# Patient Record
Sex: Female | Born: 1970 | Race: Black or African American | Hispanic: No | Marital: Married | Smoking: Never smoker
Health system: Southern US, Community
[De-identification: ages and names within clinical notes are randomized; demographics above are authoritative.]

## PROBLEM LIST (undated history)

## (undated) DIAGNOSIS — D509 Iron deficiency anemia, unspecified: Secondary | ICD-10-CM

## (undated) DIAGNOSIS — Z803 Family history of malignant neoplasm of breast: Secondary | ICD-10-CM

## (undated) DIAGNOSIS — J189 Pneumonia, unspecified organism: Secondary | ICD-10-CM

## (undated) DIAGNOSIS — Z1509 Genetic susceptibility to other malignant neoplasm: Secondary | ICD-10-CM

## (undated) DIAGNOSIS — K5792 Diverticulitis of intestine, part unspecified, without perforation or abscess without bleeding: Secondary | ICD-10-CM

## (undated) DIAGNOSIS — Z1501 Genetic susceptibility to malignant neoplasm of breast: Secondary | ICD-10-CM

## (undated) DIAGNOSIS — Z8481 Family history of carrier of genetic disease: Secondary | ICD-10-CM

## (undated) DIAGNOSIS — D219 Benign neoplasm of connective and other soft tissue, unspecified: Secondary | ICD-10-CM

## (undated) DIAGNOSIS — K219 Gastro-esophageal reflux disease without esophagitis: Secondary | ICD-10-CM

## (undated) DIAGNOSIS — D573 Sickle-cell trait: Secondary | ICD-10-CM

## (undated) DIAGNOSIS — I2699 Other pulmonary embolism without acute cor pulmonale: Secondary | ICD-10-CM

## (undated) DIAGNOSIS — M722 Plantar fascial fibromatosis: Secondary | ICD-10-CM

## (undated) HISTORY — DX: Benign neoplasm of connective and other soft tissue, unspecified: D21.9

## (undated) HISTORY — DX: Plantar fascial fibromatosis: M72.2

## (undated) HISTORY — DX: Genetic susceptibility to malignant neoplasm of breast: Z15.01

## (undated) HISTORY — DX: Family history of carrier of genetic disease: Z84.81

## (undated) HISTORY — DX: Family history of malignant neoplasm of breast: Z80.3

## (undated) HISTORY — PX: BREAST BIOPSY: SHX20

## (undated) HISTORY — DX: Genetic susceptibility to other malignant neoplasm: Z15.09

## (undated) HISTORY — PX: COLONOSCOPY: SHX174

---

## 1999-12-13 HISTORY — PX: WISDOM TOOTH EXTRACTION: SHX21

## 2009-08-12 DIAGNOSIS — J189 Pneumonia, unspecified organism: Secondary | ICD-10-CM

## 2009-08-12 HISTORY — DX: Pneumonia, unspecified organism: J18.9

## 2011-12-14 ENCOUNTER — Encounter (HOSPITAL_BASED_OUTPATIENT_CLINIC_OR_DEPARTMENT_OTHER): Payer: Self-pay

## 2011-12-14 ENCOUNTER — Emergency Department (HOSPITAL_BASED_OUTPATIENT_CLINIC_OR_DEPARTMENT_OTHER)
Admission: EM | Admit: 2011-12-14 | Discharge: 2011-12-14 | Disposition: A | Discharge status: Home / Self Care | Payer: BC Managed Care – PPO | Attending: Emergency Medicine | Admitting: Emergency Medicine

## 2011-12-14 DIAGNOSIS — IMO0002 Reserved for concepts with insufficient information to code with codable children: Secondary | ICD-10-CM | POA: Insufficient documentation

## 2011-12-14 DIAGNOSIS — X58XXXA Exposure to other specified factors, initial encounter: Secondary | ICD-10-CM | POA: Insufficient documentation

## 2011-12-14 DIAGNOSIS — T148XXA Other injury of unspecified body region, initial encounter: Secondary | ICD-10-CM

## 2011-12-14 MED ORDER — CYCLOBENZAPRINE HCL 10 MG PO TABS: 10.0000 mg | ORAL_TABLET | Freq: Two times a day (BID) | ORAL | Status: AC | PRN

## 2011-12-14 MED ORDER — NAPROXEN 500 MG PO TABS: 500.0000 mg | ORAL_TABLET | Freq: Two times a day (BID) | ORAL | Status: AC

## 2011-12-14 MED ORDER — NAPROXEN 250 MG PO TABS
500.0000 mg | ORAL_TABLET | Freq: Once | ORAL | Status: AC
  Administered 2011-12-14: 500 mg via ORAL
  Filled 2011-12-14: qty 2

## 2011-12-14 NOTE — ED Notes (Signed)
Pt c/o of right leg hurting x 2 days ago when trying to get out of bed

## 2011-12-14 NOTE — ED Provider Notes (Signed)
History     CSN: [id number]  Arrival date & time 12/14/11  0449   None     Chief Complaint  Patient presents with  . Leg Pain    Right leg/groin pain    (Consider location/radiation/quality/duration/timing/severity/associated sxs/prior treatment) HPI Comments: 2 days ago the patient states that during physical activity she developed right groin pain. This pain is worse when she moves her leg, flexes at the hip or rotates. It is also worse with palpation over the inguinal and proximal thigh area. Symptoms are persistent, mild at rest but moderate with movement and not associated with swelling, vomiting, change in bowel habits or dysuria. She has taken ibuprofen prior to arrival with mild improvement  Patient is a 41 y.o. female presenting with leg pain. The history is provided by the patient and the spouse.  Leg Pain     History reviewed. No pertinent past medical history.  No past surgical history on file.  No family history on file.  History  Substance Use Topics  . Smoking status: Not on file  . Smokeless tobacco: Not on file  . Alcohol Use: Not on file    OB History    Grav Para Term Preterm Abortions TAB SAB Ect Mult Living                  Review of Systems  Gastrointestinal: Negative for nausea, vomiting and diarrhea.  Musculoskeletal: Negative for back pain.  Skin: Negative for rash.    Allergies  Review of patient's allergies indicates no known allergies.  Home Medications   Current Outpatient Rx  Name Route Sig Dispense Refill  . CYCLOBENZAPRINE HCL 10 MG PO TABS Oral Take 1 tablet (10 mg total) by mouth 2 (two) times daily as needed for muscle spasms. 20 tablet 0  . NAPROXEN 500 MG PO TABS Oral Take 1 tablet (500 mg total) by mouth 2 (two) times daily with a meal. 30 tablet 0    BP 175/107  Pulse 87  Temp(Src) 98 F (36.7 C) (Oral)  Resp 20  SpO2 99%  Physical Exam  Constitutional: She appears well-developed and well-nourished. No distress.    HENT:  Head: Normocephalic and atraumatic.  Eyes: Conjunctivae are normal. No scleral icterus.  Cardiovascular: Normal rate, regular rhythm, normal heart sounds and intact distal pulses.   Pulmonary/Chest: Effort normal and breath sounds normal. No respiratory distress. She has no wheezes. She has no rales.  Abdominal: Soft. Bowel sounds are normal. She exhibits no distension. There is no tenderness.  Musculoskeletal: Normal range of motion. She exhibits tenderness ( Tender to palpation just distal to the anterior superior iliac spine on the right, no pain over the bony area of the pelvis. Pain with straight leg raise but ability to lift the leg off the bed and hold it for 5 seconds. No pain with flexion of the knee ). She exhibits no edema.       No pain with range of motion of the joints of the left leg or the right ankle or knee.  Neurological: She is alert.       Normal sensation of the bilateral lower extremities    ED Course  Procedures (including critical care time)  Labs Reviewed - No data to display No results found.   1. Muscle strain       MDM  Likely strain of the hip flexors on the right. Patient declines intramuscular medications, will give Naprosyn, muscle relaxant for home  Vida Roller, MD 12/14/11 [phone]

## 2011-12-14 NOTE — ED Notes (Signed)
Patient reports she was having sex with her husband and pulled the muscle in her right leg and groin.

## 2012-08-30 ENCOUNTER — Encounter (HOSPITAL_BASED_OUTPATIENT_CLINIC_OR_DEPARTMENT_OTHER): Payer: Self-pay | Admitting: *Deleted

## 2012-08-30 ENCOUNTER — Emergency Department (HOSPITAL_BASED_OUTPATIENT_CLINIC_OR_DEPARTMENT_OTHER): Payer: BC Managed Care – PPO

## 2012-08-30 ENCOUNTER — Emergency Department (HOSPITAL_BASED_OUTPATIENT_CLINIC_OR_DEPARTMENT_OTHER)
Admission: EM | Admit: 2012-08-30 | Discharge: 2012-08-30 | Disposition: A | Discharge status: Home / Self Care | Payer: BC Managed Care – PPO | Attending: Emergency Medicine | Admitting: Emergency Medicine

## 2012-08-30 ENCOUNTER — Other Ambulatory Visit: Payer: Self-pay

## 2012-08-30 DIAGNOSIS — T148XXA Other injury of unspecified body region, initial encounter: Secondary | ICD-10-CM

## 2012-08-30 DIAGNOSIS — K219 Gastro-esophageal reflux disease without esophagitis: Secondary | ICD-10-CM | POA: Insufficient documentation

## 2012-08-30 DIAGNOSIS — R079 Chest pain, unspecified: Secondary | ICD-10-CM | POA: Insufficient documentation

## 2012-08-30 HISTORY — DX: Gastro-esophageal reflux disease without esophagitis: K21.9

## 2012-08-30 LAB — CBC
MCH: 28.6 pg (ref 26.0–34.0)
MCV: 80.7 fL (ref 78.0–100.0)
Platelets: 291 10*3/uL (ref 150–400)
RBC: 4.3 MIL/uL (ref 3.87–5.11)

## 2012-08-30 LAB — BASIC METABOLIC PANEL
BUN: 8 mg/dL (ref 6–23)
CO2: 24 mEq/L (ref 19–32)
Calcium: 9.1 mg/dL (ref 8.4–10.5)
Creatinine, Ser: 0.8 mg/dL (ref 0.50–1.10)
Glucose, Bld: 78 mg/dL (ref 70–99)

## 2012-08-30 LAB — D-DIMER, QUANTITATIVE: D-Dimer, Quant: 0.45 ug/mL-FEU (ref 0.00–0.48)

## 2012-08-30 NOTE — ED Notes (Signed)
Pt c/o left rib /left chest pain with movt x 2 days

## 2012-08-30 NOTE — ED Provider Notes (Signed)
History     CSN: [id number]  Arrival date & time 08/30/12  1333   First MD Initiated Contact with Patient 08/30/12 1347      Chief Complaint  Patient presents with  . Chest Pain    (Consider location/radiation/quality/duration/timing/severity/associated sxs/prior treatment) The history is provided by the patient and medical records.   Claudia Rivas is a 41 y.o. female presents to the emergency department complaining of chest pain.  The onset of the symptoms was  abrupt starting 3 days ago.  The patient has associated pain with movement and inspiration.  The symptoms have been  intermittent, unchanged.  Movement, palpation, deep inspiration makes the symptoms worse and nothing makes symptoms better.  The patient denies fever, chills, headache, neck pain, back pain, nausea, vomiting, diarrhea, diaphoresis, lightheadedness, weakness, syncope.  Pt states her CP began 3 days ago as a sharp pain which resolved after about 1 hr.  She then had an episode of substernal "chest heaviness" yesterday about 5pm which lasted for several hours; she had no associated nausea, diaphoresis, shortness of breath or DOE.  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 The sharp pain is located in the L chest and under the L breast, it is rated at a 6/10 and radiates into the axilla.  She has no Hx of DVT.  She does take an OCP.  Pt flew from [place] on Sept 4th, but no long travel since.  She has not noticed any swelling in her legs or SOB.  Current pain increases with movement, especially moving from lying to sitting.  Patient thinks she probably just pulled a muscle but is concerned stating there is a lot of stress in her life right now.  History of GERD for which she takes Prilosec.    Past Medical History  Diagnosis Date  . GERD (gastroesophageal reflux disease)     History reviewed. No pertinent past surgical history.  History reviewed. No pertinent family history.  History    Substance Use Topics  . Smoking status: Never Smoker   . Smokeless tobacco: Not on file  . Alcohol Use: No    OB History    Grav Para Term Preterm Abortions TAB SAB Ect Mult Living                  Review of Systems  Constitutional: Negative for fever, diaphoresis, appetite change, fatigue and unexpected weight change.  HENT: Negative for mouth sores and neck stiffness.   Eyes: Negative for visual disturbance.  Respiratory: Negative for cough, chest tightness, shortness of breath and wheezing.   Cardiovascular: Positive for chest pain. Negative for palpitations and leg swelling.  Gastrointestinal: Negative for nausea, vomiting, abdominal pain, diarrhea and constipation.  Genitourinary: Negative for dysuria, urgency, frequency and hematuria.  Musculoskeletal: Negative for back pain and gait problem.  Skin: Negative for rash.  Neurological: Negative for syncope, light-headedness and headaches.  Hematological: Does not bruise/bleed easily.  Psychiatric/Behavioral: Negative for disturbed wake/sleep cycle. The patient is not nervous/anxious.     Allergies  Review of patient's allergies indicates no known allergies.  Home Medications   Current Outpatient Rx  Name Route Sig Dispense Refill  . OMEPRAZOLE 40 MG PO CPDR Oral Take 40 mg by mouth daily.    Marland Kitchen NAPROXEN 500 MG PO TABS Oral Take 1 tablet (500 mg total) by mouth 2 (two) times daily with a meal. 30 tablet 0    BP 132/82  Pulse 70  Temp 98.1  F (36.7 C) (Oral)  Resp 16  Ht 5\' 1"  (1.549 m)  Wt 156 lb (70.761 kg)  BMI 29.48 kg/m2  SpO2 100%  LMP 08/27/2012  Physical Exam  Nursing note and vitals reviewed. Constitutional: She appears well-developed and well-nourished. No distress.  HENT:  Head: Normocephalic and atraumatic.  Mouth/Throat: Oropharynx is clear and moist. No oropharyngeal exudate.  Eyes: Conjunctivae normal are normal. Pupils are equal, round, and reactive to light. No scleral icterus.  Neck: Normal  range of motion. Neck supple.  Cardiovascular: Normal rate, S1 normal, S2 normal, normal heart sounds, intact distal pulses and normal pulses.  An irregular rhythm present.  No murmur heard. Pulmonary/Chest: Effort normal and breath sounds normal. No respiratory distress. She has no decreased breath sounds. She has no wheezes. She has no rhonchi. She has no rales. She exhibits tenderness (mild in the L axilla and under the L breast).  Abdominal: Soft. Bowel sounds are normal. She exhibits no mass. There is no tenderness. There is no rebound and no guarding.  Musculoskeletal: Normal range of motion. She exhibits no edema.       Right lower leg: She exhibits no tenderness, no swelling and no edema.       Left lower leg: She exhibits no tenderness, no swelling and no edema.  Neurological: She is alert.       Speech is clear and goal oriented Moves extremities without ataxia  Skin: Skin is warm and dry. No rash noted. She is not diaphoretic.  Psychiatric: She has a normal mood and affect.    ED Course  Procedures (including critical care time)  Labs Reviewed  CBC - Abnormal; Notable for the following:    HCT 34.7 (*)     All other components within normal limits  BASIC METABOLIC PANEL - Abnormal; Notable for the following:    GFR calc non Af Amer 89 (*)     All other components within normal limits  D-DIMER, QUANTITATIVE  TROPONIN I   Dg Chest 2 View  08/30/2012  *RADIOLOGY REPORT*  Clinical Data: Left anterior chest pain and pressure.  CHEST - 2 VIEW  Comparison: None available.  Findings: The heart size is normal.  The lungs are clear.  The visualized soft tissues and bony thorax are unremarkable.  IMPRESSION: Negative chest.   Original Report Authenticated By: Jamesetta Orleans. MATTERN, M.D.    Results for orders placed during the hospital encounter of 08/30/12  CBC      Component Value Range   WBC 6.2  4.0 - 10.5 K/uL   RBC 4.30  3.87 - 5.11 MIL/uL   Hemoglobin 12.3  12.0 - 15.0 g/dL    HCT 16.1 (*) 09.6 - 46.0 %   MCV 80.7  78.0 - 100.0 fL   MCH 28.6  26.0 - 34.0 pg   MCHC 35.4  30.0 - 36.0 g/dL   RDW 04.5  40.9 - 81.1 %   Platelets 291  150 - 400 K/uL  BASIC METABOLIC PANEL      Component Value Range   Sodium 139  135 - 145 mEq/L   Potassium 3.6  3.5 - 5.1 mEq/L   Chloride 105  96 - 112 mEq/L   CO2 24  19 - 32 mEq/L   Glucose, Bld 78  70 - 99 mg/dL   BUN 8  6 - 23 mg/dL   Creatinine, Ser 9.14  0.50 - 1.10 mg/dL   Calcium 9.1  8.4 - 78.2 mg/dL  GFR calc non Af Amer 89 (*) >90 mL/min   GFR calc Af Amer >90  >90 mL/min  D-DIMER, QUANTITATIVE      Component Value Range   D-Dimer, Quant 0.45  0.00 - 0.48 ug/mL-FEU  TROPONIN I      Component Value Range   Troponin I <0.30  <0.30 ng/mL   Dg Chest 2 View  08/30/2012  *RADIOLOGY REPORT*  Clinical Data: Left anterior chest pain and pressure.  CHEST - 2 VIEW  Comparison: None available.  Findings: The heart size is normal.  The lungs are clear.  The visualized soft tissues and bony thorax are unremarkable.  IMPRESSION: Negative chest.   Original Report Authenticated By: Jamesetta Orleans. MATTERN, M.D.    ECG:  Date: 08/30/2012  Rate: 77  Rhythm: sinus arrhythmia  QRS Axis: normal  Intervals: normal  ST/T Wave abnormalities: normal  Conduction Disutrbances:none  Narrative Interpretation: sinus arrhythmia  Old EKG Reviewed: none available     1. Chest pain   2. Muscle strain       MDM  Claudia Rivas presents with chest pain x3 days.  Pt with low risk Well's PE Criteria Score.  She is not PERC negative 2/2 her OCP use, therefore we will obtain a d-dimer.  Likely MSK pain but will obtain a cardiac work-up.  BMP, CBC unremarkable. Troponin and d-dimer are negative. Chest x-ray negative.  Patient vitals stable throughout visit.  Patient alert nontoxic, nonseptic appearing.  Negative cardiac workup at this time.  Atypical chest pain that is reproducible, likely pulled muscle.  Patient is to be discharged with  recommendation to follow up with PCP in regards to today's hospital visit. Chest pain is not likely of cardiac or pulmonary etiology d/t presentation, VSS, no tracheal deviation, no JVD or new murmur, RRR, breath sounds equal bilaterally., EKG without acute abnormalities, negative troponin, and negative CXR. Pt has been advised to continue her PPI and return to the ED if CP becomes exertional, associated with diaphoresis or nausea, radiates to left jaw/arm, worsens or becomes concerning in any way. Pt appears reliable for follow up and is agreeable to discharge.     Case has been discussed with Dr. Oletta Lamas who agrees with the above plan to discharge.   1. Medications: Continue home medications as usual 2. Treatment: Rest, drink plenty of fluids, no heavy lifting or pulling 3. Follow Up: With primary care physician this week for continued evaluation  Read instructions below for reasons to return to the Emergency Department. It is recommended that your follow up with your Primary Care Doctor in regards to today's visit. If you do not have a doctor, use the resource guide listed below to help you find one. Begin taking over the counter Prilosec or Zegrid as directed.   Chest Pain (Nonspecific)  HOME CARE INSTRUCTIONS  For the next few days, avoid physical activities that bring on chest pain. Continue physical activities as directed.  Do not smoke cigarettes or drink alcohol until your symptoms are gone.  Only take over-the-counter or prescription medicine for pain, discomfort, or fever as directed by your caregiver.  Follow your caregiver's suggestions for further testing if your chest pain does not go away.  Keep any follow-up appointments you made. If you do not go to an appointment, you could develop lasting (chronic) problems with pain. If there is any problem keeping an appointment, you must call to reschedule.  SEEK MEDICAL CARE IF:  You think you are having problems from the  medicine you are  taking. Read your medicine instructions carefully.  Your chest pain does not go away, even after treatment.  You develop a rash with blisters on your chest.  SEEK IMMEDIATE MEDICAL CARE IF:  You have increased chest pain or pain that spreads to your arm, neck, jaw, back, or belly (abdomen).  You develop shortness of breath, an increasing cough, or you are coughing up blood.  You have severe back or abdominal pain, feel sick to your stomach (nauseous) or throw up (vomit).  You develop severe weakness, fainting, or chills.  You have an oral temperature above 102 F (38.9 C), not controlled by medicine.   THIS IS AN EMERGENCY. Do not wait to see if the pain will go away. Get medical help at once. Call your local emergency services (911 in [place] Do not drive yourself to the hospital.   RESOURCE GUIDE  Dental Problems  Patients with Medicaid: [facility] [phone]                                           [phone] [address] Phone:  [phone]                                                  Phone:  [phone]  If unable to pay or uninsured, contact:  [facility] or [facility]. to become qualified for the adult dental clinic.  Chronic Pain Problems Contact [facility]  [phone] Patients need to be referred by their primary care doctor.  Insufficient Money for Medicine Contact [employer]:  call "211" or [facility] [phone].  No Primary Care Doctor Call [facility]  [phone]) [phone] Other agencies that provide inexpensive medical care    [facility]  [phone]    [facility]  [phone]    [facility]  [phone]    [facility]  [phone]    [facility]  [phone]    [facility]  [phone]  Psychological Services [facility]  [phone] [facility] Services  [phone] [facility]   [phone]  (emergency services [phone])  Substance Abuse Resources [facility]  [phone] [facility] [phone] [facility] [phone] Floydene Flock [phone] Residential & Outpatient Substance Abuse Program  [phone]  Abuse/Neglect [facility] Child Abuse Hotline 937-294-6801 [facility] Child Abuse Hotline 818-217-0988 (After Hours)  Emergency Shelter [facility] 947-151-1315  M[facility] 250-364-2052 [facility] [phone]  MRSA Hotline #:   (681) 556-9263    [facility] Resources  [facility]     [employer]                          [facility] Dept. [address] [place]                       [address]  [address]  [place]                                                Cristobal Goldmann Phone:  [phone]                                   Phone:  [phone]                 Phone:  [phone]  [facility] Mental Health Phone:  [phone]  [facility] Child Abuse Hotline 310-467-4951 (210) 571-1250 (After Hours)           Dierdre Forth, PA-C 08/30/12 1619  Dierdre Forth, PA-C 08/30/12 [phone]

## 2012-08-31 NOTE — ED Provider Notes (Signed)
History/physical exam/procedure(s) were performed by non-physician practitioner and as supervising physician I was immediately available for consultation/collaboration. I have reviewed all notes and am in agreement with care and plan.   Hilario Quarry, MD 08/31/12 [phone]

## 2013-02-01 ENCOUNTER — Encounter: Payer: Self-pay | Admitting: Obstetrics & Gynecology

## 2015-11-28 DIAGNOSIS — F4321 Adjustment disorder with depressed mood: Secondary | ICD-10-CM | POA: Insufficient documentation

## 2015-11-28 DIAGNOSIS — Z91040 Latex allergy status: Secondary | ICD-10-CM | POA: Insufficient documentation

## 2015-11-28 DIAGNOSIS — D126 Benign neoplasm of colon, unspecified: Secondary | ICD-10-CM | POA: Insufficient documentation

## 2015-12-02 DIAGNOSIS — K649 Unspecified hemorrhoids: Secondary | ICD-10-CM | POA: Insufficient documentation

## 2016-08-16 ENCOUNTER — Inpatient Hospital Stay (HOSPITAL_BASED_OUTPATIENT_CLINIC_OR_DEPARTMENT_OTHER)
Admission: EM | Admit: 2016-08-16 | Discharge: 2016-08-18 | DRG: 175 | Disposition: A | Discharge status: Home / Self Care | Payer: Managed Care, Other (non HMO) | Attending: Internal Medicine | Admitting: Internal Medicine

## 2016-08-16 ENCOUNTER — Encounter (HOSPITAL_BASED_OUTPATIENT_CLINIC_OR_DEPARTMENT_OTHER): Payer: Self-pay

## 2016-08-16 ENCOUNTER — Emergency Department (HOSPITAL_BASED_OUTPATIENT_CLINIC_OR_DEPARTMENT_OTHER): Payer: Managed Care, Other (non HMO)

## 2016-08-16 DIAGNOSIS — Z803 Family history of malignant neoplasm of breast: Secondary | ICD-10-CM

## 2016-08-16 DIAGNOSIS — N920 Excessive and frequent menstruation with regular cycle: Secondary | ICD-10-CM | POA: Diagnosis present

## 2016-08-16 DIAGNOSIS — D500 Iron deficiency anemia secondary to blood loss (chronic): Secondary | ICD-10-CM | POA: Diagnosis present

## 2016-08-16 DIAGNOSIS — I2699 Other pulmonary embolism without acute cor pulmonale: Secondary | ICD-10-CM

## 2016-08-16 DIAGNOSIS — E876 Hypokalemia: Secondary | ICD-10-CM | POA: Diagnosis present

## 2016-08-16 DIAGNOSIS — I2609 Other pulmonary embolism with acute cor pulmonale: Principal | ICD-10-CM | POA: Diagnosis present

## 2016-08-16 DIAGNOSIS — K219 Gastro-esophageal reflux disease without esophagitis: Secondary | ICD-10-CM | POA: Diagnosis present

## 2016-08-16 DIAGNOSIS — D509 Iron deficiency anemia, unspecified: Secondary | ICD-10-CM | POA: Diagnosis present

## 2016-08-16 HISTORY — DX: Sickle-cell trait: D57.3

## 2016-08-16 HISTORY — DX: Iron deficiency anemia, unspecified: D50.9

## 2016-08-16 HISTORY — DX: Other pulmonary embolism without acute cor pulmonale: I26.99

## 2016-08-16 HISTORY — DX: Pneumonia, unspecified organism: J18.9

## 2016-08-16 HISTORY — DX: Diverticulitis of intestine, part unspecified, without perforation or abscess without bleeding: K57.92

## 2016-08-16 LAB — BASIC METABOLIC PANEL
ANION GAP: 7 (ref 5–15)
BUN: 10 mg/dL (ref 6–20)
CHLORIDE: 108 mmol/L (ref 101–111)
CO2: 24 mmol/L (ref 22–32)
Calcium: 8.6 mg/dL — ABNORMAL LOW (ref 8.9–10.3)
Creatinine, Ser: 0.7 mg/dL (ref 0.44–1.00)
GFR calc non Af Amer: >60 mL/min (ref >60)
GLUCOSE: 90 mg/dL (ref 65–99)
POTASSIUM: 3.6 mmol/L (ref 3.5–5.1)
SODIUM: 139 mmol/L (ref 135–145)

## 2016-08-16 LAB — CBC
HEMATOCRIT: 28.6 % — AB (ref 36.0–46.0)
Hemoglobin: 9.4 g/dL — ABNORMAL LOW (ref 12.0–15.0)
MCH: 22.9 pg — AB (ref 26.0–34.0)
MCHC: 32.9 g/dL (ref 30.0–36.0)
MCV: 69.6 fL — AB (ref 78.0–100.0)
Platelets: 354 10*3/uL (ref 150–400)
RBC: 4.11 MIL/uL (ref 3.87–5.11)
RDW: 15.1 % (ref 11.5–15.5)
WBC: 6.1 10*3/uL (ref 4.0–10.5)

## 2016-08-16 LAB — D-DIMER, QUANTITATIVE: D-Dimer, Quant: 0.84 ug/mL-FEU — ABNORMAL HIGH (ref 0.00–0.50)

## 2016-08-16 LAB — TROPONIN I: Troponin I: <0.03 ng/mL (ref <0.03)

## 2016-08-16 MED ORDER — HEPARIN BOLUS VIA INFUSION
3800.0000 [IU] | Freq: Once | INTRAVENOUS | Status: AC
  Administered 2016-08-16: 3800 [IU] via INTRAVENOUS

## 2016-08-16 MED ORDER — HEPARIN (PORCINE) IN NACL 100-0.45 UNIT/ML-% IJ SOLN
1100.0000 [IU]/h | INTRAMUSCULAR | Status: AC
  Administered 2016-08-16: 800 [IU]/h via INTRAVENOUS
  Administered 2016-08-17: 1100 [IU]/h via INTRAVENOUS
  Filled 2016-08-16 (×2): qty 250

## 2016-08-16 MED ORDER — IOPAMIDOL (ISOVUE-370) INJECTION 76%
100.0000 mL | Freq: Once | INTRAVENOUS | Status: AC | PRN
  Administered 2016-08-16: 100 mL via INTRAVENOUS

## 2016-08-16 NOTE — ED Notes (Signed)
Report given to Tonya RN

## 2016-08-16 NOTE — Progress Notes (Signed)
This is a no charge note  Transfer from [facility] per Dr. Kathrynn Humble  45 year old lady with past medical history for GERD and diverticulitis, who presents with intermittent right-sided chest pain for 2 weeks, shortness of breath. Recent flight to [place]. Found to have pulmonary embolism. Currently hemodynamically stable. Troponin negative. IV heparin was started.  Pt is accepted to tele bed for obs.  Ivor Costa, MD  [facility] Pager [phone]  If 7PM-7AM, please contact night-coverage [email or web address] Password [email or web address] 08/16/2016, 10:46 PM

## 2016-08-16 NOTE — ED Provider Notes (Signed)
[place] DEPT [facility] Provider Note   CSN: [id number] Arrival date & time: 08/16/16  1750  By signing my name below, I, Jeanell Sparrow, attest that this documentation has been prepared under the direction and in the presence of Varney Biles, MD . Electronically Signed: Jeanell Sparrow, Scribe. 08/16/2016. 6:42 PM.  History   Chief Complaint Chief Complaint  Patient presents with  . Chest Pain   The history is provided by the patient.   HPI Comments: Claudia Rivas is a 45 y.o. female who presents to the Emergency Department complaining of intermittent moderate substernal chest pain that started a few weeks ago. Pt describes the pain as heavy, radiating to her right side, exacerbated by movement, and relieved by nothing. Today's episode lasted for a full 4 hours. Associated symptoms include mild SOB and dizziness. She had a recent flight to [place] lasting 4 hours, a hx of acid reflux, and hx of constant BCP use. Denies diaphoresis, leg pain, leg swelling, hx of DVT, DM, HTN, smoking, alcohol use, other drug use, cancer, or recent surgery.   Past Medical History:  Diagnosis Date  . Diverticulitis   . GERD (gastroesophageal reflux disease)     Patient Active Problem List   Diagnosis Date Noted  . Pulmonary embolism ([place]) 08/16/2016    History reviewed. No pertinent surgical history.  OB History    No data available       Home Medications    Prior to Admission medications   Not on File    Family History No family history on file.  Social History Social History  Substance Use Topics  . Smoking status: Never Smoker  . Smokeless tobacco: Never Used  . Alcohol use Yes     Comment: occ     Allergies   Review of patient's allergies indicates no known allergies.   Review of Systems Review of Systems  10 Systems reviewed and all are negative for acute change except as noted in the HPI.  Physical Exam Updated Vital Signs BP 163/100 (BP Location: Right Arm)    Pulse 90   Temp 98.7 F (37.1 C) (Oral)   Resp 18   Ht 5\' 1"  (1.549 m)   Wt 160 lb (72.6 kg)   LMP 08/03/2016   SpO2 100%   BMI 30.23 kg/m   Physical Exam  Constitutional: She is oriented to person, place, and time. She appears well-developed and well-nourished. No distress.  HENT:  Head: Normocephalic and atraumatic.  Right Ear: External ear normal.  Left Ear: External ear normal.  Eyes: Conjunctivae are normal. Right eye exhibits no discharge. Left eye exhibits no discharge. No scleral icterus.  Neck: Neck supple. No JVD present. No tracheal deviation present.  Cardiovascular: Normal rate and regular rhythm.   Pulmonary/Chest: Effort normal. No stridor. No respiratory distress. She has no wheezes. She has no rales.  Abdominal: Soft. There is no tenderness.  Musculoskeletal: She exhibits no edema.  No pitting edema. No TTP in the BLE or calf area.   Neurological: She is alert and oriented to person, place, and time. Cranial nerve deficit: no gross deficits.  Skin: Skin is warm and dry. No rash noted.  Psychiatric: She has a normal mood and affect.  Nursing note and vitals reviewed.    ED Treatments / Results  DIAGNOSTIC STUDIES: Oxygen Saturation is 100% on RA, normal by my interpretation.    COORDINATION OF CARE: 6:51 PM- Pt advised of plan for treatment, which includes labs, and pt agrees.  Labs (  all labs ordered are listed, but only abnormal results are displayed) Labs Reviewed  BASIC METABOLIC PANEL - Abnormal; Notable for the following:       Result Value   Calcium 8.6 (*)    All other components within normal limits  CBC - Abnormal; Notable for the following:    Hemoglobin 9.4 (*)    HCT 28.6 (*)    MCV 69.6 (*)    MCH 22.9 (*)    All other components within normal limits  D-DIMER, QUANTITATIVE (NOT AT [facility]) - Abnormal; Notable for the following:    D-Dimer, Quant 0.84 (*)    All other components within normal limits  TROPONIN I  TROPONIN I    EKG   EKG Interpretation  Date/Time:  Tuesday August 16 2016 17:59:07 EDT Ventricular Rate:  79 PR Interval:  148 QRS Duration: 80 QT Interval:  366 QTC Calculation: 419 R Axis:   77 Text Interpretation:  Normal sinus rhythm Normal ECG No acute changes No significant change since last tracing Confirmed by Kathrynn Humble, MD, Thelma Comp [id number]) on 08/16/2016 6:32:40 PM       Radiology Ct Angio Chest Pe W/cm &/or Wo Cm  Result Date: 08/16/2016 CLINICAL DATA:  Right chest pain for 2 weeks intermittent. Shortness of breath. Dyspnea. Elevated D-dimer level. EXAM: CT ANGIOGRAPHY CHEST WITH CONTRAST TECHNIQUE: Multidetector CT imaging of the chest was performed using the standard protocol during bolus administration of intravenous contrast. Multiplanar CT image reconstructions and MIPs were obtained to evaluate the vascular anatomy. CONTRAST:  100 cc Isovue 370 COMPARISON:  08/30/2012 chest radiograph FINDINGS: Cardiovascular: Segmental thrombus in the anteromedial segment left lower lobe, image 67/7. This could be acute or chronic. Tiny subsegmental filling defect in the right middle lobe pulmonary artery, images 124-125 series 6, possibly also age-indeterminate embolus. 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 Mild cardiomegaly. Mediastinum/Nodes: Enlarged right axillary and subpectoral lymph nodes, a right axillary node measures 1.4 cm in short axis on image 1 28 series 4. I do not see an obvious right breast mass. An adjacent right subpectoral lymph node measures 1.3 cm in short axis on image 25/4. Left axillary lymph nodes are considerably smaller. Lungs/Pleura: Linear subsegmental atelectasis in the right lower lobe. Upper Abdomen: Scarring in the right kidney upper pole Musculoskeletal: Unremarkable Review of the MIP images confirms the above findings. IMPRESSION: 1. There is a small amount of age indeterminate pulmonary embolus in a segmental branch of the left lower lobe (anteromedial  segment). There is potentially also a tiny subsegmental marginal thrombus in the right middle lobe. Right ventricular to left ventricular ratio is 0.85, below the threshold for submassive pulmonary embolus. 2. Mild cardiomegaly. Aberrant right subclavian artery passes behind the esophagus. 3. Mildly enlarged right axillary and subpectoral lymph nodes. I do not see a breast mass but cause for this adenopathy is uncertain. Physical exam follow up is recommended to ensure that these fully resolve, if they do not think may warrant further workup to exclude the possibility of malignancy. Also if the patient is not had recent mammography then I do recommend a mammographic workup. Critical Value/emergent results were called by telephone at the time of interpretation on 08/16/2016 at 10:05 pm to Dr. Varney Biles , who verbally acknowledged these results. Electronically Signed   By: Van Clines M.D.   On: 08/16/2016 22:10    Procedures .Critical Care Performed by: Varney Biles Authorized by: Varney Biles   Critical care provider statement:    Critical care  time (minutes):  40   Critical care time was exclusive of:  Separately billable procedures and treating other patients   Critical care was necessary to treat or prevent imminent or life-threatening deterioration of the following conditions: pulmopnary embolism.   Critical care was time spent personally by me on the following activities:  Blood draw for specimens, development of treatment plan with patient or surrogate, discussions with consultants, evaluation of patient's response to treatment, examination of patient, interpretation of cardiac output measurements, obtaining history from patient or surrogate, ordering and performing treatments and interventions, ordering and review of radiographic studies, ordering and review of laboratory studies, pulse oximetry and re-evaluation of patient's condition   (including critical care  time)  Medications Ordered in ED Medications  heparin ADULT infusion 100 units/mL (25000 units/245mL sodium chloride 0.45%) (800 Units/hr Intravenous Transfusing/Transfer 08/16/16 2305)  iopamidol (ISOVUE-370) 76 % injection 100 mL (100 mLs Intravenous Contrast Given 08/16/16 2134)  heparin bolus via infusion 3,800 Units (3,800 Units Intravenous Bolus from Bag 08/16/16 2233)     Initial Impression / Assessment and Plan / ED Course  I have reviewed the triage vital signs and the nursing notes.  Pertinent labs & imaging results that were available during my care of the patient were reviewed by me and considered in my medical decision making (see chart for details).  Clinical Course    PT with atypical chest pain. She has been on birth control for several years and had recent travel. Dimer was elevated - CT PE is +. Will admit. Heparin started. Slight R sided heart strain per CT.  Final Clinical Impressions(s) / ED Diagnoses   Final diagnoses:  Other acute pulmonary embolism with acute cor pulmonale ([place])    New Prescriptions There are no discharge medications for this patient.     Varney Biles, MD 08/17/16 [phone]

## 2016-08-16 NOTE — H&P (Signed)
History and Physical    Claudia Rivas [id number] DOB: 1970-12-23 DOA: 08/16/2016  Referring MD/NP/PA:   PCP: Garlan Fillers, MD   Patient coming from:  The patient is coming from home.  At baseline, pt is independent for most of ADL.      Chief Complaint: Chest pain and shortness of breath  HPI: Claudia Rivas is a 45 y.o. female with medical history significant of diverticulitis, GERD, left foot fasciitis, who presented with chest pain and shortness breath.  Patient reports that she has been having intermittent chest pain in the past 3 weeks. The chest pain is located in the right side of chest, intermittent, sharp, 7 out of 10 in severity, nonradiating. It is aggravated by deep breath. It is associated with shortness of breath. Patient has mild dry cough, but no fever or chills. Patient states that she flight to [place] on 07/15/16 which was 4.5 hour journey. She has been taking oral contraceptive for many years.  Patient denies nausea, vomiting, diarrhea, abdominal pain, symptoms of UTI. She states that she has regular, but heavy menstrual period for many years. Has not seen any OB/GYN doctor.  ED Course: pt was found to have  positive d-dimer 0.84, troponin negative 2, WBC 6.1 hemoglobin 9.4, temperature normal, no tachycardia, mildly tachypneic, electrolytes renal function okay. Pt is placed on tele bed for obs.  # CT angiogram showed 1.There is a small amount of age indeterminate pulmonary embolus in a segmental branch of the left lower lobe (anteromedial segment). There is potentially also a tiny subsegmental marginal thrombus in the right middle lobe. Right ventricular to left ventricular ratio is 0.85, below the threshold for submassive pulmonary embolus. 2. Mild cardiomegaly. Aberrant right subclavian artery passes behind the esophagus. 3. Mildly enlarged right axillary and subpectoral lymph nodes.   Review of Systems:   General: no fevers, chills, no changes in body  weight, has fatigue HEENT: no blurry vision, hearing changes or sore throat Respiratory: has dyspnea, coughing,no wheezing CV: has chest pain, no palpitations GI: no nausea, vomiting, abdominal pain, diarrhea, constipation GU: no dysuria, burning on urination, increased urinary frequency, hematuria  Ext: no leg edema Neuro: no unilateral weakness, numbness, or tingling, no vision change or hearing loss Skin: no rash, no skin tear. MSK: No muscle spasm, no deformity, no limitation of range of movement in spin Heme: No easy bruising.  Travel history: No recent long distant travel.  Allergy: No Known Allergies  Past Medical History:  Diagnosis Date  . Diverticulitis   . GERD (gastroesophageal reflux disease)     History reviewed. No pertinent surgical history.  Social History:  reports that she has never smoked. She has never used smokeless tobacco. She reports that she drinks alcohol. She reports that she does not use drugs.  Family History:  Family History  Problem Relation Age of Onset  . Breast cancer Mother   . Crohn's disease Mother   . Crohn's disease Sister   . Diverticulitis Brother      Prior to Admission medications   Not on File    Physical Exam: Vitals:   08/16/16 2300 08/16/16 2330 08/17/16 0028 08/17/16 0029  BP: 147/85 144/90  (!) 156/92  Pulse: 87 87  89  Resp: (!) 27 21  18   Temp:    98.5 F (36.9 C)  TempSrc:    Oral  SpO2: 99% 99%  99%  Weight:   70.7 kg (155 lb 12.8 oz)   Height:  General: Not in acute distress HEENT:       Eyes: PERRL, EOMI, no scleral icterus.       ENT: No discharge from the ears and nose, no pharynx injection, no tonsillar enlargement.        Neck: No JVD, no bruit, no mass felt. Heme: No neck lymph node enlargement. Cardiac: S1/S2, RRR, No murmurs, No gallops or rubs. Respiratory: No rales, wheezing, rhonchi or rubs. GI: Soft, nondistended, nontender, no rebound pain, no organomegaly, BS present. GU: No  hematuria Ext: No pitting leg edema bilaterally. 2+DP/PT pulse bilaterally. Musculoskeletal: No joint deformities, No joint redness or warmth, no limitation of ROM in spin. Skin: No rashes.  Neuro: Alert, oriented X3, cranial nerves II-XII grossly intact, moves all extremities normally.  Psych: Patient is not psychotic, no suicidal or hemocidal ideation.  Labs on Admission: I have personally reviewed following labs and imaging studies  CBC:  Recent Labs Lab 08/16/16 1835  WBC 6.1  HGB 9.4*  HCT 28.6*  MCV 69.6*  PLT [id number]   Basic Metabolic Panel:  Recent Labs Lab 08/16/16 1835  NA 139  K 3.6  CL 108  CO2 24  GLUCOSE 90  BUN 10  CREATININE 0.70  CALCIUM 8.6*   GFR: Estimated Creatinine Clearance: 78.2 mL/min (by C-G formula based on SCr of 0.8 mg/dL). Liver Function Tests: No results for input(s): AST, ALT, ALKPHOS, BILITOT, PROT, ALBUMIN in the last 168 hours. No results for input(s): LIPASE, AMYLASE in the last 168 hours. No results for input(s): AMMONIA in the last 168 hours. Coagulation Profile: No results for input(s): INR, PROTIME in the last 168 hours. Cardiac Enzymes:  Recent Labs Lab 08/16/16 1835 08/16/16 2008  TROPONINI <0.03 <0.03   BNP (last 3 results) No results for input(s): PROBNP in the last 8760 hours. HbA1C: No results for input(s): HGBA1C in the last 72 hours. CBG: No results for input(s): GLUCAP in the last 168 hours. Lipid Profile: No results for input(s): CHOL, HDL, LDLCALC, TRIG, CHOLHDL, LDLDIRECT in the last 72 hours. Thyroid Function Tests: No results for input(s): TSH, T4TOTAL, FREET4, T3FREE, THYROIDAB in the last 72 hours. Anemia Panel: No results for input(s): VITAMINB12, FOLATE, FERRITIN, TIBC, IRON, RETICCTPCT in the last 72 hours. Urine analysis: No results found for: COLORURINE, APPEARANCEUR, LABSPEC, PHURINE, GLUCOSEU, HGBUR, BILIRUBINUR, KETONESUR, PROTEINUR, UROBILINOGEN, NITRITE, LEUKOCYTESUR Sepsis  Labs: @LABRCNTIP (procalcitonin:4,lacticidven:4) )No results found for this or any previous visit (from the past 240 hour(s)).   Radiological Exams on Admission: Ct Angio Chest Pe W/cm &/or Wo Cm  Result Date: 08/16/2016 CLINICAL DATA:  Right chest pain for 2 weeks intermittent. Shortness of breath. Dyspnea. Elevated D-dimer level. EXAM: CT ANGIOGRAPHY CHEST WITH CONTRAST TECHNIQUE: Multidetector CT imaging of the chest was performed using the standard protocol during bolus administration of intravenous contrast. Multiplanar CT image reconstructions and MIPs were obtained to evaluate the vascular anatomy. CONTRAST:  100 cc Isovue 370 COMPARISON:  08/30/2012 chest radiograph FINDINGS: Cardiovascular: Segmental thrombus in the anteromedial segment left lower lobe, image 67/7. This could be acute or chronic. Tiny subsegmental filling defect in the right middle lobe pulmonary artery, images 124-125 series 6, possibly also age-indeterminate embolus. 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 Mild cardiomegaly. Mediastinum/Nodes: Enlarged right axillary and subpectoral lymph nodes, a right axillary node measures 1.4 cm in short axis on image 1 28 series 4. I do not see an obvious right breast mass. An adjacent right subpectoral lymph node measures 1.3 cm in short axis on image  25/4. Left axillary lymph nodes are considerably smaller. Lungs/Pleura: Linear subsegmental atelectasis in the right lower lobe. Upper Abdomen: Scarring in the right kidney upper pole Musculoskeletal: Unremarkable Review of the MIP images confirms the above findings. IMPRESSION: 1. There is a small amount of age indeterminate pulmonary embolus in a segmental branch of the left lower lobe (anteromedial segment). There is potentially also a tiny subsegmental marginal thrombus in the right middle lobe. Right ventricular to left ventricular ratio is 0.85, below the threshold for submassive pulmonary embolus. 2. Mild cardiomegaly.  Aberrant right subclavian artery passes behind the esophagus. 3. Mildly enlarged right axillary and subpectoral lymph nodes. I do not see a breast mass but cause for this adenopathy is uncertain. Physical exam follow up is recommended to ensure that these fully resolve, if they do not think may warrant further workup to exclude the possibility of malignancy. Also if the patient is not had recent mammography then I do recommend a mammographic workup. Critical Value/emergent results were called by telephone at the time of interpretation on 08/16/2016 at 10:05 pm to Dr. Varney Biles , who verbally acknowledged these results. Electronically Signed   By: Van Clines M.D.   On: 08/16/2016 22:10     EKG: Independently reviewed. Sinus rhythm, QTC 419, no ischemic changes.  Assessment/Plan Principal Problem:   Pulmonary embolism ([facility]) Active Problems:   Microcytic anemia   GERD (gastroesophageal reflux disease)   Pulmonary embolism ([place]): as shown by CTA of chest.  Right ventricular to left ventricular ratio is 0.85, below the threshold for submassive pulmonary embolus. Pt has two risk factors including OCP use and recent traveling. Currently hemodynamically stable.  -will place on tele bed for obs -heparin drip initiated -2D echocardiogram ordered -LE dopplers ordered to evaluate for DVT -Hypercoag panel given her young age -hold OCP -pain control: When necessary Percocet  Microcytic anemia: Hgb 9.4 and MCV 69.6, most likely due to heavy menstrual periods -Anemia panel  GERD (gastroesophageal reflux disease) -prn protonix  Enlarged right axillary and subpectoral lymph nodes: This is inccidental finding by CT angiogram of chest. She has family history of breast cancer, her mother died of breast cancer at young age. -pt needs mammography as outpt -Need to have close follow up to ensure that these fully resolve   DVT ppx: on IV Heparin  Code Status: Full code Family Communication:  None at bed side.  Disposition Plan:  Anticipate discharge back to previous home environment Consults called:  none Admission status: Obs / tele    Date of Service 08/17/2016    Ivor Costa [facility] Pager [phone]  If 7PM-7AM, please contact night-coverage [email or web address] Password [email or web address] 08/17/2016, 2:02 AM

## 2016-08-16 NOTE — Progress Notes (Signed)
ANTICOAGULATION CONSULT NOTE - Initial Consult  Pharmacy Consult for heparin Indication: pulmonary embolus  No Known Allergies  Patient Measurements: Height: 5\' 1"  (154.9 cm) Weight: 160 lb (72.6 kg) IBW/kg (Calculated) : 47.8 Heparin Dosing Weight: 63.6kg  Vital Signs: Temp: 98.7 F (37.1 C) (09/05 1752) Temp Source: Oral (09/05 1752) BP: 152/101 (09/05 2100) Pulse Rate: 80 (09/05 2100)  Labs:  Recent Labs  08/16/16 1835 08/16/16 2008  HGB 9.4*  --   HCT 28.6*  --   PLT 354  --   CREATININE 0.70  --   TROPONINI <0.03 <0.03    Estimated Creatinine Clearance: 79.2 mL/min (by C-G formula based on SCr of 0.8 mg/dL).   Medical History: Past Medical History:  Diagnosis Date  . Diverticulitis   . GERD (gastroesophageal reflux disease)     Medications:  Infusions:  . heparin      Assessment: 48 yof presented to the ED with CP. H/H slightly low and platelets are WNL. She is not on anticoagulation PTA. To start IV heparin for PE found on CT scan.   Goal of Therapy:  Heparin level 0.3-0.7 units/ml Monitor platelets by anticoagulation protocol: Yes   Plan:  - Heparin bolus 3800 units IV x 1 - Heparin gtt 800 units/hr - Check a 6 hr heparin level - Daily heparin level and CBC  Herminio Kniskern, Rande Lawman 08/16/2016,10:27 PM

## 2016-08-16 NOTE — ED Triage Notes (Signed)
Intermittent CP x 2 weeks-constant right side CP since 230pm today-NAD-steady gait

## 2016-08-17 ENCOUNTER — Observation Stay (HOSPITAL_COMMUNITY): Payer: Self-pay

## 2016-08-17 ENCOUNTER — Observation Stay (HOSPITAL_BASED_OUTPATIENT_CLINIC_OR_DEPARTMENT_OTHER): Payer: Managed Care, Other (non HMO)

## 2016-08-17 ENCOUNTER — Encounter (HOSPITAL_COMMUNITY): Payer: Self-pay | Admitting: Internal Medicine

## 2016-08-17 DIAGNOSIS — N920 Excessive and frequent menstruation with regular cycle: Secondary | ICD-10-CM | POA: Diagnosis present

## 2016-08-17 DIAGNOSIS — K219 Gastro-esophageal reflux disease without esophagitis: Secondary | ICD-10-CM | POA: Diagnosis present

## 2016-08-17 DIAGNOSIS — E876 Hypokalemia: Secondary | ICD-10-CM

## 2016-08-17 DIAGNOSIS — D509 Iron deficiency anemia, unspecified: Secondary | ICD-10-CM | POA: Diagnosis present

## 2016-08-17 DIAGNOSIS — D500 Iron deficiency anemia secondary to blood loss (chronic): Secondary | ICD-10-CM | POA: Diagnosis present

## 2016-08-17 DIAGNOSIS — Z803 Family history of malignant neoplasm of breast: Secondary | ICD-10-CM | POA: Diagnosis not present

## 2016-08-17 DIAGNOSIS — I2699 Other pulmonary embolism without acute cor pulmonale: Secondary | ICD-10-CM

## 2016-08-17 DIAGNOSIS — I2609 Other pulmonary embolism with acute cor pulmonale: Secondary | ICD-10-CM | POA: Diagnosis present

## 2016-08-17 LAB — HEPARIN LEVEL (UNFRACTIONATED)
HEPARIN UNFRACTIONATED: 0.33 [IU]/mL (ref 0.30–0.70)
HEPARIN UNFRACTIONATED: 0.29 [IU]/mL — AB (ref 0.30–0.70)

## 2016-08-17 LAB — BASIC METABOLIC PANEL
Anion gap: 14 (ref 5–15)
BUN: 6 mg/dL (ref 6–20)
CALCIUM: 8.3 mg/dL — AB (ref 8.9–10.3)
CO2: 20 mmol/L — ABNORMAL LOW (ref 22–32)
CREATININE: 0.78 mg/dL (ref 0.44–1.00)
Chloride: 103 mmol/L (ref 101–111)
Glucose, Bld: 167 mg/dL — ABNORMAL HIGH (ref 65–99)
Potassium: 3.1 mmol/L — ABNORMAL LOW (ref 3.5–5.1)
SODIUM: 137 mmol/L (ref 135–145)

## 2016-08-17 LAB — RETICULOCYTES
RBC.: 4.22 MIL/uL (ref 3.87–5.11)
RETIC COUNT ABSOLUTE: 46.4 10*3/uL (ref 19.0–186.0)
RETIC CT PCT: 1.1 % (ref 0.4–3.1)

## 2016-08-17 LAB — MAGNESIUM: Magnesium: 2.2 mg/dL (ref 1.7–2.4)

## 2016-08-17 LAB — IRON AND TIBC
Iron: 28 ug/dL (ref 28–170)
SATURATION RATIOS: 5 % — AB (ref 10.4–31.8)
TIBC: 522 ug/dL — ABNORMAL HIGH (ref 250–450)
UIBC: 494 ug/dL

## 2016-08-17 LAB — CBC
HEMATOCRIT: 29.9 % — AB (ref 36.0–46.0)
Hemoglobin: 9.1 g/dL — ABNORMAL LOW (ref 12.0–15.0)
MCH: 21.6 pg — ABNORMAL LOW (ref 26.0–34.0)
MCHC: 30.4 g/dL (ref 30.0–36.0)
MCV: 70.9 fL — ABNORMAL LOW (ref 78.0–100.0)
PLATELETS: 338 10*3/uL (ref 150–400)
RBC: 4.22 MIL/uL (ref 3.87–5.11)
RDW: 15.4 % (ref 11.5–15.5)
WBC: 7.5 10*3/uL (ref 4.0–10.5)

## 2016-08-17 LAB — ANTITHROMBIN III: AntiThromb III Func: 96 % (ref 75–120)

## 2016-08-17 LAB — ECHOCARDIOGRAM COMPLETE
HEIGHTINCHES: 61 in
Weight: 2492.8 oz

## 2016-08-17 LAB — GLUCOSE, CAPILLARY: Glucose-Capillary: 93 mg/dL (ref 65–99)

## 2016-08-17 LAB — FERRITIN: Ferritin: 9 ng/mL — ABNORMAL LOW (ref 11–307)

## 2016-08-17 LAB — PROTIME-INR
INR: 1
PROTHROMBIN TIME: 13.2 s (ref 11.4–15.2)

## 2016-08-17 LAB — FOLATE: Folate: 13.1 ng/mL (ref >5.9)

## 2016-08-17 LAB — HCG, QUANTITATIVE, PREGNANCY: hCG, Beta Chain, Quant, S: <1 m[IU]/mL (ref <5)

## 2016-08-17 LAB — VITAMIN B12: VITAMIN B 12: 218 pg/mL (ref 180–914)

## 2016-08-17 MED ORDER — SODIUM CHLORIDE 0.9 % IV SOLN
510.0000 mg | Freq: Once | INTRAVENOUS | Status: AC
  Administered 2016-08-17: 510 mg via INTRAVENOUS
  Filled 2016-08-17: qty 17

## 2016-08-17 MED ORDER — ACETAMINOPHEN 325 MG PO TABS
650.0000 mg | ORAL_TABLET | Freq: Four times a day (QID) | ORAL | Status: DC | PRN
  Administered 2016-08-17 – 2016-08-18 (×2): 650 mg via ORAL
  Filled 2016-08-17 (×2): qty 2

## 2016-08-17 MED ORDER — SODIUM CHLORIDE 0.9 % IV SOLN
INTRAVENOUS | Status: DC
  Administered 2016-08-17 (×2): via INTRAVENOUS

## 2016-08-17 MED ORDER — OXYCODONE-ACETAMINOPHEN 5-325 MG PO TABS: 1.0000 | ORAL_TABLET | ORAL | Status: DC | PRN

## 2016-08-17 MED ORDER — PANTOPRAZOLE SODIUM 40 MG PO TBEC: 40.0000 mg | DELAYED_RELEASE_TABLET | Freq: Every day | ORAL | Status: DC | PRN

## 2016-08-17 MED ORDER — SODIUM CHLORIDE 0.9% FLUSH
3.0000 mL | Freq: Two times a day (BID) | INTRAVENOUS | Status: DC
  Administered 2016-08-17 – 2016-08-18 (×3): 3 mL via INTRAVENOUS

## 2016-08-17 MED ORDER — POTASSIUM CHLORIDE CRYS ER 20 MEQ PO TBCR
40.0000 meq | EXTENDED_RELEASE_TABLET | ORAL | Status: AC
  Administered 2016-08-17 (×2): 40 meq via ORAL
  Filled 2016-08-17 (×2): qty 2

## 2016-08-17 MED ORDER — POLYSACCHARIDE IRON COMPLEX 150 MG PO CAPS
150.0000 mg | ORAL_CAPSULE | Freq: Every day | ORAL | Status: DC
  Administered 2016-08-18: 150 mg via ORAL
  Filled 2016-08-17: qty 1

## 2016-08-17 MED ORDER — PANTOPRAZOLE SODIUM 40 MG PO TBEC
40.0000 mg | DELAYED_RELEASE_TABLET | Freq: Every day | ORAL | Status: DC
Start: 2016-08-17 — End: 2016-08-18
  Administered 2016-08-17 – 2016-08-18 (×2): 40 mg via ORAL
  Filled 2016-08-17 (×2): qty 1

## 2016-08-17 MED ORDER — ONDANSETRON HCL 4 MG/2ML IJ SOLN: 4.0000 mg | Freq: Four times a day (QID) | INTRAMUSCULAR | Status: DC | PRN

## 2016-08-17 MED ORDER — ONDANSETRON HCL 4 MG PO TABS: 4.0000 mg | ORAL_TABLET | Freq: Four times a day (QID) | ORAL | Status: DC | PRN

## 2016-08-17 MED ORDER — ACETAMINOPHEN 650 MG RE SUPP: 650.0000 mg | Freq: Four times a day (QID) | RECTAL | Status: DC | PRN

## 2016-08-17 NOTE — Progress Notes (Signed)
ANTICOAGULATION CONSULT NOTE Pharmacy Consult for heparin Indication: pulmonary embolus  No Known Allergies  Patient Measurements: Height: 5\' 1"  (154.9 cm) Weight: 155 lb 12.8 oz (70.7 kg) IBW/kg (Calculated) : 47.8 Heparin Dosing Weight: 63.6kg  Vital Signs: Temp: 98.6 F (37 C) (09/06 0336) Temp Source: Oral (09/06 0336) BP: 127/83 (09/06 0336) Pulse Rate: 83 (09/06 0336)  Labs:  Recent Labs  08/16/16 1835 08/16/16 2008 08/17/16 0335 08/17/16 1247  HGB 9.4*  --  9.1*  --   HCT 28.6*  --  29.9*  --   PLT 354  --  338  --   LABPROT  --   --  13.2  --   INR  --   --  1.00  --   HEPARINUNFRC  --   --  0.33 0.29*  CREATININE 0.70  --  0.78  --   TROPONINI <0.03 <0.03  --   --     Estimated Creatinine Clearance: 78.2 mL/min (by C-G formula based on SCr of 0.8 mg/dL).  Assessment: 45 y.o. female with PE for heparin PM HL slightly low at 0.29  Goal of Therapy:  Heparin level 0.3-0.7 units/ml Monitor platelets by anticoagulation protocol: Yes   Plan:  Increase Heparin to 1100 units/hr  Planning transition to Xarelto in AM  Thank you Anette Guarneri, PharmD [phone] 08/17/2016,1:55 PM

## 2016-08-17 NOTE — Progress Notes (Addendum)
Patient received from [facility] and oriented to room. Tele monitoring and skin assessment completed.[facility] p[facility]. Call light within reach.

## 2016-08-17 NOTE — Progress Notes (Signed)
Chaplain presented to the patient's room for pastoral visitation, and to provide an Advance Directive for the patient. Chaplain will leave Directive for review, and follow up for completion at the patient's directive. Offered prayer of healing, wellbeing. [place] [phone]

## 2016-08-17 NOTE — Progress Notes (Signed)
PROGRESS NOTE    Claudia Rivas  [id number] DOB: 1971-11-20 DOA: 08/16/2016 PCP: Garlan Fillers, MD    Brief Narrative:   Claudia Rivas is a 45 y.o. female with medical history significant of diverticulitis, GERD, left foot fasciitis, who presented with chest pain and shortness breath.  Patient reports that she has been having intermittent chest pain in the past 3 weeks. The chest pain is located in the right side of chest, intermittent, sharp, 7 out of 10 in severity, nonradiating. It is aggravated by deep breath. It is associated with shortness of breath. Patient has mild dry cough, but no fever or chills. Patient states that she flight to [place] on 07/15/16 which was 4.5 hour journey. She has been taking oral contraceptive for many years.  Patient denies nausea, vomiting, diarrhea, abdominal pain, symptoms of UTI. She states that she has regular, but heavy menstrual period for many years. Has not seen any OB/GYN doctor.  ED Course: pt was found to have  positive d-dimer 0.84, troponin negative 2, WBC 6.1 hemoglobin 9.4, temperature normal, no tachycardia, mildly tachypneic, electrolytes renal function okay. Pt is placed on tele bed for obs.  # CT angiogram showed 1.There is a small amount of age indeterminate pulmonary embolus in a segmental branch of the left lower lobe (anteromedial segment). There is potentially also a tiny subsegmental marginal thrombus in the right middle lobe. Right ventricular to left ventricular ratio is 0.85, below the threshold for submassive pulmonary embolus. 2. Mild cardiomegaly. Aberrant right subclavian artery passes behind the esophagus. 3. Mildly enlarged right axillary and subpectoral lymph nodes.    Assessment & Plan:   Principal Problem:   Pulmonary embolism (HCC) Active Problems:   Microcytic anemia   GERD (gastroesophageal reflux disease)   Hypokalemia   Iron deficiency anemia   #1 pulmonary embolism Per CT angiogram of the  chest. Right ventricular to left ventricular ratio 0.85 which is below the thresholds for submassive pulmonary embolus. Patient with risk factors of oral contraceptive use and recent long travel. Patient with clinical improvement. 2-D echo pending. Lower extremity Dopplers pending to rule out DVT. Hypercoagulable panel has been ordered and is pending. Will discontinue oral contraceptive medications for now. Continue IV heparin. Likely transition to a NOAC in the next 24-48 hours. Patient will likely need treatment and he went from 3-6 months with close outpatient follow-up with PCP.  #2 microcytic anemia/iron deficiency anemia Likely due to heavy menstrual periods. Patient endorses pica. Hemoglobin stable at 9.1. Monitor closely with anticoagulation. Will start oral iron supplementation tomorrow. Outpatient follow-up.  #3 hypokalemia Replete. Magnesium is 2.2.  #4 gastroesophageal reflux disease PPI.  #5 enlarged right axillary and subpectoral lymph nodes Incidental finding on CT angiogram chest. Patient does have a family history of breast cancer with mother deceased of breast cancer at a young age. Patient states is being followed up closely and had a mammogram done approximately a year ago which she was told was normal. Patient likely need repeat scans in a year.   DVT prophylaxis: IV heparin Code Status: Full Family Communication: Updated patient and family at bedside. Disposition Plan: Home when medically stable of late 24-48 hours.   Consultants:   None  Procedures:   2-D echo pending.   Lower extremity Dopplers pending.  Antimicrobials:   None   Subjective: Patient denies any chest. Patient denies any shortness of breath.  Objective: Vitals:   08/16/16 2330 08/17/16 0028 08/17/16 0029 08/17/16 0336  BP: 144/90  (!) 156/92 127/83  Pulse: 87  89 83  Resp: 21  18 18   Temp:   98.5 F (36.9 C) 98.6 F (37 C)  TempSrc:   Oral Oral  SpO2: 99%  99% 100%  Weight:   70.7 kg (155 lb 12.8 oz)    Height:        Intake/Output Summary (Last 24 hours) at 08/17/16 1258 Last data filed at 08/17/16 1001  Gross per 24 hour  Intake                0 ml  Output             1050 ml  Net            -1050 ml   Filed Weights   08/16/16 1752 08/17/16 0028  Weight: 72.6 kg (160 lb) 70.7 kg (155 lb 12.8 oz)    Examination:  General exam: Appears calm and comfortable  Respiratory system: Clear to auscultation. Respiratory effort normal. Cardiovascular system: S1 & S2 heard, RRR. No JVD, murmurs, rubs, gallops or clicks. No pedal edema. Gastrointestinal system: Abdomen is nondistended, soft and nontender. No organomegaly or masses felt. Normal bowel sounds heard. Central nervous system: Alert and oriented. No focal neurological deficits. Extremities: Symmetric 5 x 5 power. Skin: No rashes, lesions or ulcers Psychiatry: Judgement and insight appear normal. Mood & affect appropriate.     Data Reviewed: I have personally reviewed following labs and imaging studies  CBC:  Recent Labs Lab 08/16/16 1835 08/17/16 0335  WBC 6.1 7.5  HGB 9.4* 9.1*  HCT 28.6* 29.9*  MCV 69.6* 70.9*  PLT 354 [id number]   Basic Metabolic Panel:  Recent Labs Lab 08/16/16 1835 08/17/16 0335  NA 139 137  K 3.6 3.1*  CL 108 103  CO2 24 20*  GLUCOSE 90 167*  BUN 10 6  CREATININE 0.70 0.78  CALCIUM 8.6* 8.3*  MG  --  2.2   GFR: Estimated Creatinine Clearance: 78.2 mL/min (by C-G formula based on SCr of 0.8 mg/dL). Liver Function Tests: No results for input(s): AST, ALT, ALKPHOS, BILITOT, PROT, ALBUMIN in the last 168 hours. No results for input(s): LIPASE, AMYLASE in the last 168 hours. No results for input(s): AMMONIA in the last 168 hours. Coagulation Profile:  Recent Labs Lab 08/17/16 0335  INR 1.00   Cardiac Enzymes:  Recent Labs Lab 08/16/16 1835 08/16/16 2008  TROPONINI <0.03 <0.03   BNP (last 3 results) No results for input(s): PROBNP in the last 8760  hours. HbA1C: No results for input(s): HGBA1C in the last 72 hours. CBG:  Recent Labs Lab 08/17/16 0843  GLUCAP 93   Lipid Profile: No results for input(s): CHOL, HDL, LDLCALC, TRIG, CHOLHDL, LDLDIRECT in the last 72 hours. Thyroid Function Tests: No results for input(s): TSH, T4TOTAL, FREET4, T3FREE, THYROIDAB in the last 72 hours. Anemia Panel:  Recent Labs  08/17/16 0335  VITAMINB12 218  FOLATE 13.1  FERRITIN 9*  TIBC 522*  IRON 28  RETICCTPCT 1.1   Sepsis Labs: No results for input(s): PROCALCITON, LATICACIDVEN in the last 168 hours.  No results found for this or any previous visit (from the past 240 hour(s)).       Radiology Studies: Ct Angio Chest Pe W/cm &/or Wo Cm  Result Date: 08/16/2016 CLINICAL DATA:  Right chest pain for 2 weeks intermittent. Shortness of breath. Dyspnea. Elevated D-dimer level. EXAM: CT ANGIOGRAPHY CHEST WITH CONTRAST TECHNIQUE: Multidetector CT imaging of the chest was performed using the standard protocol during bolus  administration of intravenous contrast. Multiplanar CT image reconstructions and MIPs were obtained to evaluate the vascular anatomy. CONTRAST:  100 cc Isovue 370 COMPARISON:  08/30/2012 chest radiograph FINDINGS: Cardiovascular: Segmental thrombus in the anteromedial segment left lower lobe, image 67/7. This could be acute or chronic. Tiny subsegmental filling defect in the right middle lobe pulmonary artery, images 124-125 series 6, possibly also age-indeterminate embolus. 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 Mild cardiomegaly. Mediastinum/Nodes: Enlarged right axillary and subpectoral lymph nodes, a right axillary node measures 1.4 cm in short axis on image 1 28 series 4. I do not see an obvious right breast mass. An adjacent right subpectoral lymph node measures 1.3 cm in short axis on image 25/4. Left axillary lymph nodes are considerably smaller. Lungs/Pleura: Linear subsegmental atelectasis in the right  lower lobe. Upper Abdomen: Scarring in the right kidney upper pole Musculoskeletal: Unremarkable Review of the MIP images confirms the above findings. IMPRESSION: 1. There is a small amount of age indeterminate pulmonary embolus in a segmental branch of the left lower lobe (anteromedial segment). There is potentially also a tiny subsegmental marginal thrombus in the right middle lobe. Right ventricular to left ventricular ratio is 0.85, below the threshold for submassive pulmonary embolus. 2. Mild cardiomegaly. Aberrant right subclavian artery passes behind the esophagus. 3. Mildly enlarged right axillary and subpectoral lymph nodes. I do not see a breast mass but cause for this adenopathy is uncertain. Physical exam follow up is recommended to ensure that these fully resolve, if they do not think may warrant further workup to exclude the possibility of malignancy. Also if the patient is not had recent mammography then I do recommend a mammographic workup. Critical Value/emergent results were called by telephone at the time of interpretation on 08/16/2016 at 10:05 pm to Dr. Varney Biles , who verbally acknowledged these results. Electronically Signed   By: Van Clines M.D.   On: 08/16/2016 22:10        Scheduled Meds: . [START ON 08/18/2016] iron polysaccharides  150 mg Oral Daily  . pantoprazole  40 mg Oral Daily  . potassium chloride  40 mEq Oral Q4H  . sodium chloride flush  3 mL Intravenous Q12H   Continuous Infusions: . sodium chloride 100 mL/hr at 08/17/16 0309  . heparin 950 Units/hr (08/17/16 0412)     LOS: 0 days    Time spent: 34 minutes    Candice Lunney, MD [facility] Pager [phone]  If 7PM-7AM, please contact night-coverage [email or web address] Password [email or web address] 08/17/2016, 12:58 PM

## 2016-08-17 NOTE — Care Management Note (Signed)
Case Management Note Marvetta Gibbons RN, BSN Unit 2W-Case Manager [phone]  Patient Details  Name: Ecko Bangs MRN: [id number] Date of Birth: [birth year]  Subjective/Objective:   Pt admitted with PE                 Action/Plan: PTA pt lived at home with spouse- spoke with pt and husband at bedside - per conversation- pt's husband states that he started a new job- just hit the 82 day mark for insurance yesterday 9/5- pt will have insurance coverage for medications- husband states that insurance will be through [place] does not have cards yet- if pt starts on Xarelto- can use 30 day free card on d/c from hospital and then pts pharmacy can check copay once they received their insurance cards.   Expected Discharge Date:                  Expected Discharge Plan:  Home/Self Care  In-House Referral:     Discharge planning Services  CM Consult, Medication Assistance  Post Acute Care Choice:    Choice offered to:     DME Arranged:    DME Agency:     HH Arranged:    HH Agency:     Status of Service:  In process, will continue to follow  If discussed at Long Length of Stay Meetings, dates discussed:    Additional Comments:  Dawayne Patricia, RN 08/17/2016, 11:28 AM

## 2016-08-17 NOTE — Progress Notes (Signed)
ANTICOAGULATION CONSULT NOTE Pharmacy Consult for heparin Indication: pulmonary embolus  No Known Allergies  Patient Measurements: Height: 5\' 1"  (154.9 cm) Weight: 155 lb 12.8 oz (70.7 kg) IBW/kg (Calculated) : 47.8 Heparin Dosing Weight: 63.6kg  Vital Signs: Temp: 98.6 F (37 C) (09/06 0336) Temp Source: Oral (09/06 0336) BP: 127/83 (09/06 0336) Pulse Rate: 83 (09/06 0336)  Labs:  Recent Labs  08/16/16 1835 08/16/16 2008 08/17/16 0335  HGB 9.4*  --  9.1*  HCT 28.6*  --  29.9*  PLT 354  --  338  HEPARINUNFRC  --   --  0.33  CREATININE 0.70  --   --   TROPONINI <0.03 <0.03  --     Estimated Creatinine Clearance: 78.2 mL/min (by C-G formula based on SCr of 0.8 mg/dL).  Assessment: 45 y.o. female with PE for heparin  Goal of Therapy:  Heparin level 0.3-0.7 units/ml Monitor platelets by anticoagulation protocol: Yes   Plan:  Increase Heparin 950 units/hr to keep in range Recheck level later this morning to verify  Caryl Pina 08/17/2016,4:03 AM

## 2016-08-18 ENCOUNTER — Inpatient Hospital Stay (HOSPITAL_COMMUNITY): Payer: Self-pay

## 2016-08-18 ENCOUNTER — Inpatient Hospital Stay (HOSPITAL_COMMUNITY): Payer: Managed Care, Other (non HMO)

## 2016-08-18 DIAGNOSIS — I2609 Other pulmonary embolism with acute cor pulmonale: Principal | ICD-10-CM

## 2016-08-18 DIAGNOSIS — I2699 Other pulmonary embolism without acute cor pulmonale: Secondary | ICD-10-CM

## 2016-08-18 LAB — GLUCOSE, CAPILLARY: Glucose-Capillary: 103 mg/dL — ABNORMAL HIGH (ref 65–99)

## 2016-08-18 LAB — CBC
HCT: 30.2 % — ABNORMAL LOW (ref 36.0–46.0)
HEMOGLOBIN: 9.1 g/dL — AB (ref 12.0–15.0)
MCH: 21.5 pg — AB (ref 26.0–34.0)
MCHC: 30.1 g/dL (ref 30.0–36.0)
MCV: 71.4 fL — AB (ref 78.0–100.0)
PLATELETS: 343 10*3/uL (ref 150–400)
RBC: 4.23 MIL/uL (ref 3.87–5.11)
RDW: 15.4 % (ref 11.5–15.5)
WBC: 5.8 10*3/uL (ref 4.0–10.5)

## 2016-08-18 LAB — PROTEIN S, TOTAL: PROTEIN S AG TOTAL: 86 % (ref 60–150)

## 2016-08-18 LAB — BASIC METABOLIC PANEL
Anion gap: 7 (ref 5–15)
CHLORIDE: 111 mmol/L (ref 101–111)
CO2: 21 mmol/L — ABNORMAL LOW (ref 22–32)
Calcium: 8.5 mg/dL — ABNORMAL LOW (ref 8.9–10.3)
Creatinine, Ser: 0.85 mg/dL (ref 0.44–1.00)
GFR calc Af Amer: >60 mL/min (ref >60)
GFR calc non Af Amer: >60 mL/min (ref >60)
GLUCOSE: 102 mg/dL — AB (ref 65–99)
POTASSIUM: 3.8 mmol/L (ref 3.5–5.1)
Sodium: 139 mmol/L (ref 135–145)

## 2016-08-18 LAB — PROTEIN C, TOTAL: Protein C, Total: 97 % (ref 60–150)

## 2016-08-18 LAB — HOMOCYSTEINE: Homocysteine: 8.5 umol/L (ref 0.0–15.0)

## 2016-08-18 LAB — LUPUS ANTICOAGULANT PANEL
DRVVT: 36 s (ref 0.0–47.0)
PTT Lupus Anticoagulant: 41 s (ref 0.0–51.9)

## 2016-08-18 LAB — PROTEIN C ACTIVITY: Protein C Activity: 131 % (ref 73–180)

## 2016-08-18 LAB — HEPARIN LEVEL (UNFRACTIONATED): Heparin Unfractionated: 0.63 IU/mL (ref 0.30–0.70)

## 2016-08-18 LAB — PROTEIN S ACTIVITY: Protein S Activity: 89 % (ref 63–140)

## 2016-08-18 MED ORDER — RIVAROXABAN (XARELTO) EDUCATION KIT FOR DVT/PE PATIENTS
PACK | Freq: Once | Status: AC
  Administered 2016-08-18: 12:00:00
  Filled 2016-08-18: qty 1

## 2016-08-18 MED ORDER — POLYSACCHARIDE IRON COMPLEX 150 MG PO CAPS: 150.0000 mg | ORAL_CAPSULE | Freq: Every day | ORAL | 3 refills | Status: DC

## 2016-08-18 MED ORDER — RIVAROXABAN 20 MG PO TABS: 20.0000 mg | ORAL_TABLET | Freq: Every day | ORAL | 4 refills | Status: DC

## 2016-08-18 MED ORDER — PANTOPRAZOLE SODIUM 40 MG PO TBEC: 40.0000 mg | DELAYED_RELEASE_TABLET | Freq: Every day | ORAL | 3 refills | Status: DC

## 2016-08-18 MED ORDER — RIVAROXABAN 15 MG PO TABS
15.0000 mg | ORAL_TABLET | Freq: Two times a day (BID) | ORAL | Status: DC
  Administered 2016-08-18: 15 mg via ORAL
  Filled 2016-08-18: qty 1

## 2016-08-18 MED ORDER — RIVAROXABAN (XARELTO) VTE STARTER PACK (15 & 20 MG): ORAL_TABLET | ORAL | 0 refills | Status: DC

## 2016-08-18 NOTE — Progress Notes (Signed)
Patient D/C home with husband. Xerelto education done. Handout given. IV removed. Tele monitor. CCMD notified. Personal belongings taken with husband. Patient no CP and no SOB. Patient to D/C home with husband.  Domingo Dimes RN

## 2016-08-18 NOTE — Discharge Instructions (Signed)
Information on my medicine - XARELTO (rivaroxaban)  This medication education was reviewed with me or my healthcare representative as part of my discharge preparation.  The pharmacist that spoke with me during my hospital stay was:  Arty Baumgartner, [place]? Xarelto was prescribed to treat blood clots that may have been found in the veins of your legs (deep vein thrombosis) or in your lungs (pulmonary embolism) and to reduce the risk of them occurring again.  What do you need to know about Xarelto? The starting dose is one 15 mg tablet taken TWICE daily with food for the FIRST 21 DAYS then on (enter date)  09/08/16  the dose is changed to one 20 mg tablet taken ONCE A DAY with your evening meal.  DO NOT stop taking Xarelto without talking to the health care provider who prescribed the medication.  Refill your prescription for 20 mg tablets before you run out.  After discharge, you should have regular check-up appointments with your healthcare provider that is prescribing your Xarelto.  In the future your dose may need to be changed if your kidney function changes by a significant amount.  What do you do if you miss a dose? If you are taking Xarelto TWICE DAILY and you miss a dose, take it as soon as you remember. You may take two 15 mg tablets (total 30 mg) at the same time then resume your regularly scheduled 15 mg twice daily the next day.  If you are taking Xarelto ONCE DAILY and you miss a dose, take it as soon as you remember on the same day then continue your regularly scheduled once daily regimen the next day. Do not take two doses of Xarelto at the same time.   Important Safety Information Xarelto is a blood thinner medicine that can cause bleeding. You should call your healthcare provider right away if you experience any of the following: ? Bleeding from an injury or your nose that does not stop. ? Unusual colored urine (red or dark brown)  or unusual colored stools (red or black). ? Unusual bruising for unknown reasons. ? A serious fall or if you hit your head (even if there is no bleeding).  Some medicines may interact with Xarelto and might increase your risk of bleeding while on Xarelto. To help avoid this, consult your healthcare provider or pharmacist prior to using any new prescription or non-prescription medications, including herbals, vitamins, non-steroidal anti-inflammatory drugs (NSAIDs) and supplements.  This website has more information on Xarelto: [email or web address]

## 2016-08-18 NOTE — Progress Notes (Signed)
VASCULAR LAB PRELIMINARY  PRELIMINARY  PRELIMINARY  PRELIMINARY  Bilateral lower extremity venous duplex completed.    Preliminary report:  There is no DVT or SVT noted in the bilateral lower extremities.   Mathias Bogacki, RVT 08/18/2016, 10:27 AM

## 2016-08-18 NOTE — Discharge Summary (Addendum)
Physician Discharge Summary  Claudia Rivas [id number] DOB: 1971/05/28 DOA: 08/16/2016  PCP: Garlan Fillers, MD  Admit date: 08/16/2016 Discharge date: 08/18/2016  Time spent: 65 minutes  Recommendations for Outpatient Follow-up:  1. Follow-up with Garlan Fillers, MD A1 to 2 weeks. On follow-up patient will need a CBC done to follow-up on her hemoglobin. Patient need a basic metabolic profile done to follow-up on electrolytes and renal function. Patient will also need to discuss other forms of contraception with PCP as patient has been told not to resume oral anticoagulation in light of her pulmonary embolus. 2. On follow-up with Garlan Fillers, MD, hypercoagulable panel results will need to be followed up upon. CT scan of enlarged right axillary and subpectoral lymph nodes only to be followed up upon by physical exam and possible mammogram. Will defer to PCP.   Discharge Diagnoses:  Principal Problem:   Pulmonary embolism with acute cor pulmonale (HCC) Active Problems:   Microcytic anemia   GERD (gastroesophageal reflux disease)   Hypokalemia   Iron deficiency anemia   Discharge Condition: Stable and improved  Diet recommendation: Regular  Filed Weights   08/16/16 1752 08/17/16 0028  Weight: 72.6 kg (160 lb) 70.7 kg (155 lb 12.8 oz)    History of present illness:  Per Dr.Niu  Claudia Rivas is a 45 y.o. female with medical history significant of diverticulitis, GERD, left foot fasciitis, who presented with chest pain and shortness breath.  Patient reported that she had been having intermittent chest pain in the past 3 weeks. The chest pain was located in the right side of chest, intermittent, sharp, 7 out of 10 in severity, nonradiating. It was aggravated by deep breath. It was associated with shortness of breath. Patient has mild dry cough, but no fever or chills. Patient stated that she had a recent flight to [place] on 07/15/16 which was 4.5 hour journey. She has been  taking oral contraceptive for many years.  Patient denied nausea, vomiting, diarrhea, abdominal pain, symptoms of UTI. She stated that she has regular, but heavy menstrual period for many years. Has not seen any OB/GYN doctor.  ED Course: pt was found to have  positive d-dimer 0.84, troponin negative 2, WBC 6.1 hemoglobin 9.4, temperature normal, no tachycardia, mildly tachypneic, electrolytes renal function okay. Pt was placed on tele bed.  # CT angiogram showed 1.There is a small amount of age indeterminate pulmonary embolus in a segmental branch of the left lower lobe (anteromedial segment). There is potentially also a tiny subsegmental marginal thrombus in the right middle lobe. Right ventricular to left ventricular ratio is 0.85, below the threshold for submassive pulmonary embolus. 2. Mild cardiomegaly. Aberrant right subclavian artery passes behind the esophagus. 3. Mildly enlarged right axillary and subpectoral lymph nodes.   Hospital Course:  #1 pulmonary embolism Per CT angiogram of the chest. Right ventricular to left ventricular ratio 0.85 which is below the thresholds for submassive pulmonary embolus. Patient with risk factors of oral contraceptive use and recent long travel. Patient was admitted. Patient's chest pain and shortness of breath improved during the hospitalization and patient overall clinically improved. 2-D echo was obtained Patient with clinical improvement. 2-D echo showed a EF of 55-60% with no wall motion abnormalities, no right ventricular strain. Lower extremity Dopplers were negative for DVT.  Hypercoagulable panel has been ordered and results pending. Patient's oral contraceptive medications were held. Patient was transitioned from IV heparin to oral Xarelto which she tolerated. Patient's oral contraceptive medications discontinued and patient advised not  to resume them. as this is patient's first ever embolism patient will likely need to be on anticoagulation anywhere  from 3-6 months with close outpatient follow-up.   #2 microcytic anemia/iron deficiency anemia Likely due to heavy menstrual periods. Patient endorsed pica. Hemoglobin stable at 9.1. Patient was given IV iron during the hospitalization subsequently started on oral iron supplementation. Outpatient follow-up.  #3 hypokalemia Repleted. Magnesium was 2.2.  #4 gastroesophageal reflux disease Patient was maintained on a PPI.  #5 enlarged right axillary and subpectoral lymph nodes Incidental finding on CT angiogram chest. Patient does have a family history of breast cancer with mother deceased of breast cancer at a young age. Patient stated is being followed up closely and had a mammogram done approximately a year ago which she was told was normal. Patient likely needs physical exam follow-up to assure lymph nodes are full he resolved. Patient may also need repeat mammogram. Outpatient follow-up.    Procedures:  CT angiogram chest 08/16/2016  2-D echo 08/17/2016  Lower extremity Dopplers 08/18/2016  Consultations:  None  Discharge Exam: Vitals:   08/17/16 1945 08/18/16 0458  BP: (!) 150/99 (!) 140/93  Pulse: 82 75  Resp: 18 18  Temp: 98.4 F (36.9 C) 97.6 F (36.4 C)    General: NAD Cardiovascular: RRR Respiratory: CTAB  Discharge Instructions   Discharge Instructions    Diet general    Complete by:  As directed   Discharge instructions    Complete by:  As directed   STOP TAKING ORAL CONTRACEPTIVE PILLS.   Increase activity slowly    Complete by:  As directed     Current Discharge Medication List    START taking these medications   Details  iron polysaccharides (NIFEREX) 150 MG capsule Take 1 capsule (150 mg total) by mouth daily. Qty: 30 capsule, Refills: 3    pantoprazole (PROTONIX) 40 MG tablet Take 1 tablet (40 mg total) by mouth daily. Qty: 30 tablet, Refills: 3    Rivaroxaban (XARELTO STARTER PACK) 15 & 20 MG TBPK Take as directed on package: Start  with one 15mg  tablet by mouth twice a day with food. On Day 22, switch to one 20mg  tablet once a day with food. Qty: 51 each, Refills: 0    rivaroxaban (XARELTO) 20 MG TABS tablet Take 1 tablet (20 mg total) by mouth daily with supper. Qty: 30 tablet, Refills: 4      STOP taking these medications     norethindrone-ethinyl estradiol (GILDESS FE 1/20) 1-20 MG-MCG tablet        No Known Allergies Follow-up Information    Garlan Fillers, MD. Schedule an appointment as soon as possible for a visit in 2 week(s).   Specialty:  Family Medicine Why:  F/U WITH PCP IN 1-2 WEEKS. Contact information: [address] [place]  [address] [phone]            The results of significant diagnostics from this hospitalization (including imaging, microbiology, ancillary and laboratory) are listed below for reference.    Significant Diagnostic Studies: Ct Angio Chest Pe W/cm &/or Wo Cm  Result Date: 08/16/2016 CLINICAL DATA:  Right chest pain for 2 weeks intermittent. Shortness of breath. Dyspnea. Elevated D-dimer level. EXAM: CT ANGIOGRAPHY CHEST WITH CONTRAST TECHNIQUE: Multidetector CT imaging of the chest was performed using the standard protocol during bolus administration of intravenous contrast. Multiplanar CT image reconstructions and MIPs were obtained to evaluate the vascular anatomy. CONTRAST:  100 cc Isovue 370 COMPARISON:  08/30/2012 chest radiograph  FINDINGS: Cardiovascular: Segmental thrombus in the anteromedial segment left lower lobe, image 67/7. This could be acute or chronic. Tiny subsegmental filling defect in the right middle lobe pulmonary artery, images 124-125 series 6, possibly also age-indeterminate embolus. 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 Mild cardiomegaly. Mediastinum/Nodes: Enlarged right axillary and subpectoral lymph nodes, a right axillary node measures 1.4 cm in short axis on image 1 28 series 4. I do not see an obvious right breast  mass. An adjacent right subpectoral lymph node measures 1.3 cm in short axis on image 25/4. Left axillary lymph nodes are considerably smaller. Lungs/Pleura: Linear subsegmental atelectasis in the right lower lobe. Upper Abdomen: Scarring in the right kidney upper pole Musculoskeletal: Unremarkable Review of the MIP images confirms the above findings. IMPRESSION: 1. There is a small amount of age indeterminate pulmonary embolus in a segmental branch of the left lower lobe (anteromedial segment). There is potentially also a tiny subsegmental marginal thrombus in the right middle lobe. Right ventricular to left ventricular ratio is 0.85, below the threshold for submassive pulmonary embolus. 2. Mild cardiomegaly. Aberrant right subclavian artery passes behind the esophagus. 3. Mildly enlarged right axillary and subpectoral lymph nodes. I do not see a breast mass but cause for this adenopathy is uncertain. Physical exam follow up is recommended to ensure that these fully resolve, if they do not think may warrant further workup to exclude the possibility of malignancy. Also if the patient is not had recent mammography then I do recommend a mammographic workup. Critical Value/emergent results were called by telephone at the time of interpretation on 08/16/2016 at 10:05 pm to Dr. Varney Biles , who verbally acknowledged these results. Electronically Signed   By: Van Clines M.D.   On: 08/16/2016 22:10    Microbiology: No results found for this or any previous visit (from the past 240 hour(s)).   Labs: Basic Metabolic Panel:  Recent Labs Lab 08/16/16 1835 08/17/16 0335 08/18/16 0214  NA 139 137 139  K 3.6 3.1* 3.8  CL 108 103 111  CO2 24 20* 21*  GLUCOSE 90 167* 102*  BUN 10 6 <5*  CREATININE 0.70 0.78 0.85  CALCIUM 8.6* 8.3* 8.5*  MG  --  2.2  --    Liver Function Tests: No results for input(s): AST, ALT, ALKPHOS, BILITOT, PROT, ALBUMIN in the last 168 hours. No results for input(s):  LIPASE, AMYLASE in the last 168 hours. No results for input(s): AMMONIA in the last 168 hours. CBC:  Recent Labs Lab 08/16/16 1835 08/17/16 0335 08/18/16 0214  WBC 6.1 7.5 5.8  HGB 9.4* 9.1* 9.1*  HCT 28.6* 29.9* 30.2*  MCV 69.6* 70.9* 71.4*  PLT 354 338 343   Cardiac Enzymes:  Recent Labs Lab 08/16/16 1835 08/16/16 2008  TROPONINI <0.03 <0.03   BNP: BNP (last 3 results) No results for input(s): BNP in the last 8760 hours.  ProBNP (last 3 results) No results for input(s): PROBNP in the last 8760 hours.  CBG:  Recent Labs Lab 08/17/16 0843 08/18/16 0623  GLUCAP 93 103*       Signed:  Clent Damore MD.  [facility] 08/18/2016, 2:23 PM

## 2016-08-18 NOTE — Care Management Note (Signed)
Case Management Note Marvetta Gibbons RN, BSN Unit 2W-Case Manager [phone]  Patient Details  Name: Claudia Rivas MRN: [id number] Date of Birth: 07-09-1971  Subjective/Objective:   Pt admitted with PE                 Action/Plan: PTA pt lived at home with spouse- spoke with pt and husband at bedside - per conversation- pt's husband states that he started a new job- just hit the 55 day mark for insurance yesterday 9/5- pt will have insurance coverage for medications- husband states that insurance will be through [place] does not have cards yet- if pt starts on Xarelto- can use 30 day free card on d/c from hospital and then pts pharmacy can check copay once they received their insurance cards.   Expected Discharge Date:    08/18/16              Expected Discharge Plan:  Home/Self Care  In-House Referral:     Discharge planning Services  CM Consult, Medication Assistance  Post Acute Care Choice:    Choice offered to:     DME Arranged:    DME Agency:     HH Arranged:    HH Agency:     Status of Service:  Completed, signed off  If discussed at [employer] of Stay Meetings, dates discussed:    Additional Comments:  08/18/16- 1200- Maxum Cassarino RN, CM- pt started on Xarelto- education kit given to pt which includes a 30 day free card for use on discharge- plan for MD to write for starter kit- pt will need to take paper prescription to pharmacy along with 30 day free card to fill- this has been explained to pt with verbal understanding from pt. She has been given addresses and phone # to [facility] along with [facility] and [facility] which all carry the starter kits in stock to fill.   Dawayne Patricia, RN 08/18/2016, 12:28 PM

## 2016-08-18 NOTE — Progress Notes (Signed)
ANTICOAGULATION CONSULT NOTE - Follow Up Consult  Pharmacy Consult for Heparin -> Xarelto Indication: pulmonary embolus  No Known Allergies  Patient Measurements: Height: '5\' 1"'  (154.9 cm) Weight: 155 lb 12.8 oz (70.7 kg) IBW/kg (Calculated) : 47.8 Heparin Dosing Weight: 63.6 kg  Vital Signs: Temp: 97.6 F (36.4 C) (09/07 0458) Temp Source: Oral (09/07 0458) BP: 140/93 (09/07 0458) Pulse Rate: 75 (09/07 0458)  Labs:  Recent Labs  08/16/16 1835 08/16/16 2008 08/17/16 0335 08/17/16 1247 08/18/16 0214  HGB 9.4*  --  9.1*  --  9.1*  HCT 28.6*  --  29.9*  --  30.2*  PLT 354  --  338  --  343  LABPROT  --   --  13.2  --   --   INR  --   --  1.00  --   --   HEPARINUNFRC  --   --  0.33 0.29* 0.63  CREATININE 0.70  --  0.78  --  0.85  TROPONINI <0.03 <0.03  --   --   --     Estimated Creatinine Clearance: 73.6 mL/min (by C-G formula based on SCr of 0.85 mg/dL).  Assessment:  45 yr old female on IV heparin for PE.  Heparin level therapeutic (0.63) on 1100 units/hr.  To transition to Xarelto today.  Goal of Therapy:  Heparin level 0.3-0.7 units/ml treatment dose Xarelto Monitor platelets by anticoagulation protocol: Yes   Plan:   Xarelto 15 mg BID x 21 days, then 20 mg daily with supper.  Stop IV heparin when giving first dose of Xarelto.  Discussed Xarelto dosing plan, precautions and importance of compliance.  Discharge education kit ordered.  Xarelto starter pack to be prescribed at discharge for first month's supply.    Arty Baumgartner, [place] Pager: [phone] 08/18/2016,8:56 AM

## 2016-08-19 LAB — BETA-2-GLYCOPROTEIN I ABS, IGG/M/A: Beta-2-Glycoprotein I IgA: <9 GPI IgA units (ref 0–25)

## 2016-08-19 LAB — CARDIOLIPIN ANTIBODIES, IGG, IGM, IGA: ANTICARDIOLIPIN IGM: 9 [MPL'U]/mL (ref 0–12)

## 2016-08-22 LAB — FACTOR 5 LEIDEN

## 2016-08-22 LAB — PROTHROMBIN GENE MUTATION

## 2016-08-31 DIAGNOSIS — T8332XA Displacement of intrauterine contraceptive device, initial encounter: Secondary | ICD-10-CM | POA: Insufficient documentation

## 2016-09-13 ENCOUNTER — Encounter: Payer: Self-pay | Admitting: Family

## 2016-09-13 ENCOUNTER — Ambulatory Visit (HOSPITAL_BASED_OUTPATIENT_CLINIC_OR_DEPARTMENT_OTHER): Payer: Managed Care, Other (non HMO) | Admitting: Family

## 2016-09-13 ENCOUNTER — Other Ambulatory Visit (HOSPITAL_BASED_OUTPATIENT_CLINIC_OR_DEPARTMENT_OTHER): Payer: Managed Care, Other (non HMO)

## 2016-09-13 ENCOUNTER — Ambulatory Visit: Payer: Managed Care, Other (non HMO)

## 2016-09-13 ENCOUNTER — Other Ambulatory Visit: Payer: Self-pay

## 2016-09-13 ENCOUNTER — Ambulatory Visit: Payer: Self-pay

## 2016-09-13 ENCOUNTER — Ambulatory Visit: Payer: Self-pay | Admitting: Family

## 2016-09-13 ENCOUNTER — Other Ambulatory Visit: Payer: Self-pay | Admitting: Family

## 2016-09-13 VITALS — BP 138/93 | HR 81 | Temp 98.2°F | Resp 16 | Ht 61.0 in | Wt 158.8 lb

## 2016-09-13 DIAGNOSIS — I2699 Other pulmonary embolism without acute cor pulmonale: Secondary | ICD-10-CM

## 2016-09-13 DIAGNOSIS — E611 Iron deficiency: Secondary | ICD-10-CM | POA: Diagnosis not present

## 2016-09-13 DIAGNOSIS — I2609 Other pulmonary embolism with acute cor pulmonale: Secondary | ICD-10-CM

## 2016-09-13 DIAGNOSIS — Z853 Personal history of malignant neoplasm of breast: Secondary | ICD-10-CM

## 2016-09-13 DIAGNOSIS — R590 Localized enlarged lymph nodes: Secondary | ICD-10-CM

## 2016-09-13 DIAGNOSIS — D508 Other iron deficiency anemias: Secondary | ICD-10-CM

## 2016-09-13 LAB — CBC WITH DIFFERENTIAL (CANCER CENTER ONLY)
BASO#: 0 10*3/uL (ref 0.0–0.2)
BASO%: 0.2 % (ref 0.0–2.0)
EOS%: 0.6 % (ref 0.0–7.0)
Eosinophils Absolute: 0 10*3/uL (ref 0.0–0.5)
HEMATOCRIT: 35.4 % (ref 34.8–46.6)
HGB: 11.9 g/dL (ref 11.6–15.9)
LYMPH#: 1.8 10*3/uL (ref 0.9–3.3)
LYMPH%: 36 % (ref 14.0–48.0)
MCH: 25.5 pg — ABNORMAL LOW (ref 26.0–34.0)
MCHC: 33.6 g/dL (ref 32.0–36.0)
MCV: 76 fL — ABNORMAL LOW (ref 81–101)
MONO#: 0.4 10*3/uL (ref 0.1–0.9)
MONO%: 7.3 % (ref 0.0–13.0)
NEUT#: 2.9 10*3/uL (ref 1.5–6.5)
NEUT%: 55.9 % (ref 39.6–80.0)
PLATELETS: 303 10*3/uL (ref 145–400)
RBC: 4.67 10*6/uL (ref 3.70–5.32)
RDW: 22.4 % — ABNORMAL HIGH (ref 11.1–15.7)
WBC: 5.1 10*3/uL (ref 3.9–10.0)

## 2016-09-13 LAB — COMPREHENSIVE METABOLIC PANEL
ALT: 28 U/L (ref 0–55)
ANION GAP: 8 meq/L (ref 3–11)
AST: 24 U/L (ref 5–34)
Albumin: 3.7 g/dL (ref 3.5–5.0)
Alkaline Phosphatase: 72 U/L (ref 40–150)
BUN: 7.8 mg/dL (ref 7.0–26.0)
CO2: 25 meq/L (ref 22–29)
CREATININE: 0.8 mg/dL (ref 0.6–1.1)
Calcium: 9.4 mg/dL (ref 8.4–10.4)
Chloride: 109 mEq/L (ref 98–109)
EGFR: >90 mL/min/{1.73_m2} (ref >90)
Glucose: 96 mg/dl (ref 70–140)
Potassium: 3.9 mEq/L (ref 3.5–5.1)
Sodium: 142 mEq/L (ref 136–145)
TOTAL PROTEIN: 8 g/dL (ref 6.4–8.3)

## 2016-09-13 NOTE — Progress Notes (Signed)
Hematology/Oncology Consultation   Name: Claudia Rivas      MRN: [id number]    Location: Room/bed info not found  Date: 09/13/2016 Time:4:25 PM   REFERRING PHYSICIAN: Addison Naegeli, MD  REASON FOR CONSULT: Pulmonary embolism with acute cor pulmonale   DIAGNOSIS:  Bilateral pulmonary emboli  Mildly enlarged right axillary and subpectoral lymph nodes Strong maternal family history of breast cancer  HISTORY OF PRESENT ILLNESS: Claudia Rivas is a very pleasant 45 yo African American female with recent diagnosis of bilateral PE's. She had been on birth control for over 20 years. She flew to [place] in August and then drove to [place] to help her daughter move into her dorm.  She developed chest pain and went to the ED where they confirmed the bilateral PE's. She states that she had an [place] of both lower extremities and was negative for DVT's.  Her birth control was stopped at that time and she then had a heavy cycle lasting 2 weeks. She has also had frequent hot flashes since stopping her BC. She plans to discuss getting an IUD with her gynecologist.  She was placed on Heparin during admission on 9/5 and then transitioned to the Xarelto starter pack.  She was found to be iron deficient during admission and received one dose of Feraheme. Ferritin at that time was 9 with an iron saturation of 5%. She is now on an oral iron supplement. Her Hgb is now up to 11.9 with an MCV of 76.  So far her lab work up in September was negative for cause of thrombus. She has a significant history of breast cancer on her mother's side. Her mother and 2 maternal aunts have passed away from breast cancer. Her mother was only 27 at time of death. She currently has 1 aunt with breast cancer still living. She had a mammogram in early September that showed no mass but mildly enlarged right axillary and subpectoral lymph nodes. No lymphadenopathy found on assessment but she was tender in both axilla on exam.  She is  walking for exercise each day. Her SOB with exertion is improving.  She is having a good deal of muscle spasms of the chest, shoulders and cramping in her legs that comes and goes. She has had a couple episodes of palpitations.  She has a mild headache that come about an hour after she takes her xarelto each day. She will try taking tylenol PRN. She will continue to avoid NSAIDS.  No episodes of bleeding or bruising.  She has maintained a good appetite but admits tht she should be drinking more water. We discussed the importance of staying well hydrated. She was chewing a great deal of ice but this is resolving.  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 This will be fine now that she is on Xarelto.  No fever, chills, n/v, cough, rash, dizziness, abdominal pain or changes in bowel or bladder habits.  No swelling, tenderness, numbness or tingling in her extremities.   ROS: All other 10 point review of systems is negative.   PAST MEDICAL HISTORY:   Past Medical History:  Diagnosis Date  . Diverticulitis   . GERD (gastroesophageal reflux disease)   . Headache    "monthly" (08/17/2016)  . Iron deficiency anemia    received iron transfusion 08/17/2016  . Pneumonia 08/2009  . Pulmonary embolism ([place]) 08/16/2016  . Sickle cell trait (HCC)     ALLERGIES: No  Known Allergies    MEDICATIONS:  Current Outpatient Prescriptions on File Prior to Visit  Medication Sig Dispense Refill  . iron polysaccharides (NIFEREX) 150 MG capsule Take 1 capsule (150 mg total) by mouth daily. 30 capsule 3  . pantoprazole (PROTONIX) 40 MG tablet Take 1 tablet (40 mg total) by mouth daily. 30 tablet 3  . Rivaroxaban (XARELTO STARTER PACK) 15 & 20 MG TBPK Take as directed on package: Start with one 59m tablet by mouth twice a day with food. On Day 22, switch to one 225mtablet once a day with food. 51 each 0  . [START ON 09/17/2016] rivaroxaban (XARELTO) 20 MG TABS tablet Take 1  tablet (20 mg total) by mouth daily with supper. 30 tablet 4   No current facility-administered medications on file prior to visit.      PAST SURGICAL HISTORY Past Surgical History:  Procedure Laterality Date  . NO PAST SURGERIES      FAMILY HISTORY: Family History  Problem Relation Age of Onset  . Breast cancer Mother   . Crohn's disease Mother   . Crohn's disease Sister   . Diverticulitis Brother     SOCIAL HISTORY:  reports that she has never smoked. She has never used smokeless tobacco. She reports that she drinks about 1.8 oz of alcohol per week . She reports that she does not use drugs.  PERFORMANCE STATUS: The patient's performance status is 1 - Symptomatic but completely ambulatory  PHYSICAL EXAM: Most Recent Vital Signs: Blood pressure (!) 138/93, pulse 81, temperature 98.2 F (36.8 C), temperature source Oral, resp. rate 16, height _0  (1.549 m), weight 158 lb 12.8 oz (72 kg), last menstrual period 08/03/2016. BP (!) 138/93 (BP Location: Left Arm, Patient Position: Sitting)   Pulse 81   Temp 98.2 F (36.8 C) (Oral)   Resp 16   Ht _1  (1.549 m)   Wt 158 lb 12.8 oz (72 kg)   LMP 08/03/2016   BMI 30.00 kg/m   General Appearance:    Alert, cooperative, no distress, appears stated age  Head:    Normocephalic, without obvious abnormality, atraumatic  Eyes:    PERRL, conjunctiva/corneas clear, EOM's intact, fundi    benign, both eyes             Throat:   Lips, mucosa, and tongue normal; teeth and gums normal  Neck:   Supple, symmetrical, trachea midline, no adenopathy;       thyroid:  No enlargement/tenderness/nodules; no carotid   bruit or JVD  Back:     Symmetric, no curvature, ROM normal, no CVA tenderness  Lungs:     Clear to auscultation bilaterally, respirations unlabored  Chest wall:    No tenderness or deformity  Heart:    Regular rate and rhythm, S1 and S2 normal, no murmur, rub   or gallop  Abdomen:     Soft, non-tender, bowel sounds active all  four quadrants,    no masses, no organomegaly        Extremities:   Extremities normal, atraumatic, no cyanosis or edema  Pulses:   2+ and symmetric all extremities  Skin:   Skin color, texture, turgor normal, no rashes or lesions  Lymph nodes:   Cervical, supraclavicular, and axillary nodes normal on exam but bilateral axilla were tender   Neurologic:   CNII-XII intact. Normal strength, sensation and reflexes      throughout    LABORATORY DATA: Results for orders placed or performed in visit  on 09/13/16 (from the past 48 hour(s))  CBC w/Diff     Status: Abnormal   Collection Time: 09/13/16 12:02 PM  Result Value Ref Range   WBC 5.1 3.9 - 10.0 10e3/uL   RBC 4.67 3.70 - 5.32 10e6/uL   HGB 11.9 11.6 - 15.9 g/dL   HCT 35.4 34.8 - 46.6 %   MCV 76 (L) 81 - 101 fL   MCH 25.5 (L) 26.0 - 34.0 pg   MCHC 33.6 32.0 - 36.0 g/dL   RDW 22.4 (H) 11.1 - 15.7 %   Platelets 303 145 - 400 10e3/uL   NEUT# 2.9 1.5 - 6.5 10e3/uL   LYMPH# 1.8 0.9 - 3.3 10e3/uL   MONO# 0.4 0.1 - 0.9 10e3/uL   Eosinophils Absolute 0.0 0.0 - 0.5 10e3/uL   BASO# 0.0 0.0 - 0.2 10e3/uL   NEUT% 55.9 39.6 - 80.0 %   LYMPH% 36.0 14.0 - 48.0 %   MONO% 7.3 0.0 - 13.0 %   EOS% 0.6 0.0 - 7.0 %   BASO% 0.2 0.0 - 2.0 %  CMP     Status: None   Collection Time: 09/13/16 12:02 PM  Result Value Ref Range   Sodium 142 136 - 145 mEq/L   Potassium 3.9 3.5 - 5.1 mEq/L   Chloride 109 98 - 109 mEq/L   CO2 25 22 - 29 mEq/L   Glucose 96 70 - 140 mg/dl    Comment: Glucose reference range is for nonfasting patients. Fasting glucose reference range is 70- 100.   BUN 7.8 7.0 - 26.0 mg/dL   Creatinine 0.8 0.6 - 1.1 mg/dL   Total Bilirubin <0.30 0.20 - 1.20 mg/dL   Alkaline Phosphatase 72 40 - 150 U/L   AST 24 5 - 34 U/L   ALT 28 0 - 55 U/L   Total Protein 8.0 6.4 - 8.3 g/dL   Albumin 3.7 3.5 - 5.0 g/dL   Calcium 9.4 8.4 - 10.4 mg/dL   Anion Gap 8 3 - 11 mEq/L   EGFR >90 >90 ml/min/1.73 m2    Comment: eGFR is calculated using the  CKD-EPI Creatinine Equation (2009)      RADIOGRAPHY: No results found.     PATHOLOGY: None  ASSESSMENT/PLAN: Ms. Kydd is a very pleasant 45 yo [place] American female with recent diagnosis of bilateral PE's occurring while on birth control and while doing a good bit of traveling by plane and car. She is the birth control and taking Xarelto 20 mg PO daily.   She will likely need 1 1/2 - 2 years of full anticoagulation.   She was also found to have mildly enlarged right axillary and subpectoral lymph nodes on her recent mammogram. We will repeat the mammogram, right breast US and CT angio in 3 months to reassess lymphadenopathy and PEs.  She was found to be iron deficient while in the hospital and received 1 dose of Feraheme. She is now on a daily oral iron supplement and has had a nice response. Her Hgb and MCV continue to improve.  She is ok from our standpoint to return to work. She will continue to stay well hydrated and taker her anticoagulant as prescribed.  We will plan to see her back in 3 months to review scans, repeat labs and follow-up.   All questions were answered. She will contact our office with any problems, questions or concerns. We can certainly see her much sooner if necessary.  She was discussed with and also seen by  Dr. Marin Olp and he is in agreement with the aforementioned.   [facility]     Addendum:  I saw and examined the patient with Sarah. She has bilateral pulmonary emboli. She was on birth control pills. She has been traveling quite a bit. I would think these might be the risk factors that have caused this.  I think she will need at least a year of full anticoagulation. I probably would repeat her scans in about 3 months to see how things have improved.  I am very interested and the fact that there is this strong family history of breast cancer. She apparently has some lymphadenopathy that was seen on scan., I am not sure if this was a mammogram  or if this was some sonogram. This, I believe, will have to be followed up.. I probably would do this in about 2 months or so. There really is nothing on her physical exam that is suspicious.  We spent about 40 minutes with her. She is very nice.  We will plan to get her back in 3 months and then reevaluate her with our CT angiogram .  Lattie Haw, MD

## 2016-09-14 ENCOUNTER — Other Ambulatory Visit: Payer: Self-pay | Admitting: Family

## 2016-09-14 DIAGNOSIS — R590 Localized enlarged lymph nodes: Secondary | ICD-10-CM

## 2016-09-15 ENCOUNTER — Other Ambulatory Visit: Payer: Self-pay | Admitting: Family

## 2016-09-15 ENCOUNTER — Telehealth: Payer: Self-pay | Admitting: *Deleted

## 2016-09-15 DIAGNOSIS — D573 Sickle-cell trait: Secondary | ICD-10-CM

## 2016-09-15 LAB — HEMOGLOBINOPATHY EVALUATION
HEMOGLOBIN F QUANTITATION: 0 % (ref 0.0–2.0)
HGB C: 0 %
HGB S: 36.5 % — ABNORMAL HIGH
Hemoglobin A2 Quantitation: 4 % — ABNORMAL HIGH (ref 0.7–3.1)
Hgb A: 59.5 % — ABNORMAL LOW (ref 94.0–98.0)

## 2016-09-15 MED ORDER — FOLIC ACID 1 MG PO TABS: 1.0000 mg | ORAL_TABLET | Freq: Every day | ORAL | 6 refills | Status: DC

## 2016-09-15 NOTE — Telephone Encounter (Signed)
-----   Message from Eliezer Bottom, NP sent at 09/15/2016  2:38 PM EDT ----- Regarding: Sickle cell trait Patient positive for the sickle cell trait. Prescription for folic acid was sent to her pharmacy. Thank you!  SArah  ----- Message ----- From: Interface, Lab In Three Zero One Sent: 09/13/2016  12:10 PM To: Eliezer Bottom, NP

## 2016-09-15 NOTE — Progress Notes (Signed)
Patient positive for sickle cell trait. We will have her start taking folic acid daily.

## 2016-09-16 ENCOUNTER — Telehealth: Payer: Self-pay | Admitting: *Deleted

## 2016-09-16 NOTE — Telephone Encounter (Addendum)
Generic message left on voice mail. Information left about new prescription. Asked patient to call back.   ----- Message from Eliezer Bottom, NP sent at 09/15/2016  2:38 PM EDT ----- Regarding: Sickle cell trait Patient positive for the sickle cell trait. Prescription for folic acid was sent to her pharmacy. Thank you!  SArah  ----- Message ----- From: Interface, Lab In Three Zero One Sent: 09/13/2016  12:10 PM To: Eliezer Bottom, NP

## 2016-09-18 ENCOUNTER — Encounter (HOSPITAL_BASED_OUTPATIENT_CLINIC_OR_DEPARTMENT_OTHER): Payer: Self-pay | Admitting: Emergency Medicine

## 2016-09-18 ENCOUNTER — Emergency Department (HOSPITAL_BASED_OUTPATIENT_CLINIC_OR_DEPARTMENT_OTHER)
Admission: EM | Admit: 2016-09-18 | Discharge: 2016-09-18 | Disposition: A | Discharge status: Home / Self Care | Payer: Managed Care, Other (non HMO) | Attending: Emergency Medicine | Admitting: Emergency Medicine

## 2016-09-18 DIAGNOSIS — K1379 Other lesions of oral mucosa: Secondary | ICD-10-CM | POA: Insufficient documentation

## 2016-09-18 DIAGNOSIS — K068 Other specified disorders of gingiva and edentulous alveolar ridge: Secondary | ICD-10-CM | POA: Diagnosis present

## 2016-09-18 DIAGNOSIS — R58 Hemorrhage, not elsewhere classified: Secondary | ICD-10-CM

## 2016-09-18 NOTE — Discharge Instructions (Signed)
Call Dr. Antonieta Pert office Monday morning to advise them that you're having bleeding from the mouth. In the meantime if you develop additional bleeding in the mouth you can pack a moistened teabag onto the bleeding site.

## 2016-09-18 NOTE — ED Provider Notes (Signed)
[facility] Provider Note: Georgena Spurling, MD, FACEP  CSN: [id number] MRN: [id number] ARRIVAL: 09/18/16 at [place]  Claudia Rivas is a 45 y.o. female who was recently started on Xarelto for bilateral pulmonary emboli. She is here with 2 episodes of oral bleeding in the past 2 days. The bleeding has been mild and consists of blood mixed with saliva. It was present in her mouth when she awoke from sleep. She believes this morning the bleeding was from the left side of her tongue. She denies oral trauma. There is no current bleeding. She continues to have some right sided chest heaviness which is persisted since her initial diagnosis last month; it has not acutely changed. She denies coughing up blood.   Past Medical History:  Diagnosis Date  . Diverticulitis   . GERD (gastroesophageal reflux disease)   . Headache    "monthly" (08/17/2016)  . Iron deficiency anemia    received iron transfusion 08/17/2016  . Pneumonia 08/2009  . Pulmonary embolism ([place]) 08/16/2016  . Sickle cell trait [facility])     Past Surgical History:  Procedure Laterality Date  . NO PAST SURGERIES      Family History  Problem Relation Age of Onset  . Breast cancer Mother   . Crohn's disease Mother   . Crohn's disease Sister   . Diverticulitis Brother     Social History  Substance Use Topics  . Smoking status: Never Smoker  . Smokeless tobacco: Never Used  . Alcohol use 1.8 oz/week    3 Glasses of wine per week    Prior to Admission medications   Medication Sig Start Date End Date Taking? Authorizing Provider  folic acid (FOLVITE) 1 MG tablet Take 1 tablet (1 mg total) by mouth daily. 09/15/16   Eliezer Bottom, NP  iron polysaccharides (NIFEREX) 150 MG capsule Take 1 capsule (150 mg total) by mouth daily. 08/18/16   Eugenie Filler, MD  pantoprazole (PROTONIX) 40 MG tablet Take 1 tablet (40 mg total) by mouth daily. 08/18/16    Eugenie Filler, MD  Rivaroxaban (XARELTO STARTER PACK) 15 & 20 MG TBPK Take as directed on package: Start with one 15mg  tablet by mouth twice a day with food. On Day 22, switch to one 20mg  tablet once a day with food. 08/18/16   Eugenie Filler, MD  rivaroxaban (XARELTO) 20 MG TABS tablet Take 1 tablet (20 mg total) by mouth daily with supper. 09/17/16   Eugenie Filler, MD    Allergies Review of patient's allergies indicates no known allergies.   REVIEW OF SYSTEMS  Negative except as noted here or in the History of Present Illness.   PHYSICAL EXAMINATION  Initial Vital Signs Blood pressure 155/97, pulse 79, temperature 98.5 F (36.9 C), temperature source Oral, resp. rate 18, height 5\' 1"  (1.549 m), weight 158 lb (71.7 kg), last menstrual period 07/03/2016, SpO2 100 %.  Examination General: Well-developed, well-nourished female in no acute distress; appearance consistent with age of record HENT: normocephalic; atraumatic; no intraoral bleeding seen Eyes: pupils equal, round and reactive to light; extraocular muscles intact Neck: supple Heart: regular rate and rhythm Lungs: clear to auscultation bilaterally Abdomen: soft; nondistended Extremities: No deformity; full range of motion Neurologic: Awake, alert and oriented; motor function intact in all extremities and symmetric; no facial droop Skin: Warm and dry Psychiatric: Normal mood and affect   RESULTS  Summary of this  visit's results, reviewed by myself:   EKG Interpretation  Date/Time:    Ventricular Rate:    PR Interval:    QRS Duration:   QT Interval:    QTC Calculation:   R Axis:     Text Interpretation:        Laboratory Studies: No results found for this or any previous visit (from the past 24 hour(s)). Imaging Studies: No results found.  ED COURSE  Nursing notes and initial vitals signs, including pulse oximetry, reviewed.  Vitals:   09/18/16 0223  BP: 155/97  Pulse: 79  Resp: 18  Temp: 98.5  F (36.9 C)  TempSrc: Oral  SpO2: 100%  Weight: 158 lb (71.7 kg)  Height: 5\' 1"  (1.549 m)   Patient was advised to contact Dr. Antonieta Pert office tomorrow morning as he is her hematologist. There does not appear to be any need for intervention at this time as she has no active bleeding.  PROCEDURES    ED DIAGNOSES     ICD-9-CM ICD-10-CM   1. Mucosal bleeding 459.0 R58        Shanon Rosser, MD 09/18/16 0330

## 2016-09-18 NOTE — ED Triage Notes (Signed)
Patient states that she was put on Xrelto in Sept for a blood clot, The patient reports that last night she noticed some blood on there tongue. Today she has started to have blood when she spits

## 2016-09-19 ENCOUNTER — Telehealth: Payer: Self-pay | Admitting: *Deleted

## 2016-09-19 NOTE — Telephone Encounter (Signed)
Patient called stating she was in the ED for bleeding in her oral mucosa.  Mostly on the side of her tongue.  She was released.  Told Dr. Marin Olp.  No orders received.  Patient told to call us if gets worse.

## 2016-09-21 ENCOUNTER — Other Ambulatory Visit: Payer: Self-pay | Admitting: Family

## 2016-09-22 ENCOUNTER — Telehealth: Payer: Self-pay | Admitting: *Deleted

## 2016-09-22 NOTE — Telephone Encounter (Signed)
Patient is on Xarelto and c/o severe indigestion with chest tightness and pressure more so on the right side than left. She believes this to be related to her Xarelto. She is taking her protonix.   Spoke with Dr Marin Olp. While he doesn't believe that her symptoms are related to the Xarelto, she will need to be evaluated in the ED if she experiences these symptoms again as a more serious cardiac episode cannot be excluded. Patient understands these instructions.

## 2016-09-28 ENCOUNTER — Encounter (HOSPITAL_COMMUNITY): Payer: Self-pay | Admitting: Emergency Medicine

## 2016-09-28 ENCOUNTER — Emergency Department (HOSPITAL_COMMUNITY)
Admission: EM | Admit: 2016-09-28 | Discharge: 2016-09-28 | Disposition: A | Discharge status: Home / Self Care | Payer: Managed Care, Other (non HMO) | Attending: Emergency Medicine | Admitting: Emergency Medicine

## 2016-09-28 ENCOUNTER — Emergency Department (HOSPITAL_COMMUNITY): Payer: Managed Care, Other (non HMO)

## 2016-09-28 DIAGNOSIS — R0789 Other chest pain: Secondary | ICD-10-CM | POA: Insufficient documentation

## 2016-09-28 DIAGNOSIS — R030 Elevated blood-pressure reading, without diagnosis of hypertension: Secondary | ICD-10-CM | POA: Insufficient documentation

## 2016-09-28 DIAGNOSIS — Z7901 Long term (current) use of anticoagulants: Secondary | ICD-10-CM | POA: Diagnosis not present

## 2016-09-28 LAB — I-STAT CHEM 8, ED
BUN: 8 mg/dL (ref 6–20)
CREATININE: 0.8 mg/dL (ref 0.44–1.00)
Calcium, Ion: 1.15 mmol/L (ref 1.15–1.40)
Chloride: 106 mmol/L (ref 101–111)
GLUCOSE: 99 mg/dL (ref 65–99)
HEMATOCRIT: 41 % (ref 36.0–46.0)
HEMOGLOBIN: 13.9 g/dL (ref 12.0–15.0)
Potassium: 3.9 mmol/L (ref 3.5–5.1)
Sodium: 143 mmol/L (ref 135–145)
TCO2: 25 mmol/L (ref 0–100)

## 2016-09-28 LAB — CBC WITH DIFFERENTIAL/PLATELET
BASOS PCT: 0 %
Basophils Absolute: 0 10*3/uL (ref 0.0–0.1)
EOS PCT: 1 %
Eosinophils Absolute: 0 10*3/uL (ref 0.0–0.7)
HEMATOCRIT: 36.5 % (ref 36.0–46.0)
Hemoglobin: 12.1 g/dL (ref 12.0–15.0)
LYMPHS ABS: 1.5 10*3/uL (ref 0.7–4.0)
Lymphocytes Relative: 36 %
MCH: 25.5 pg — AB (ref 26.0–34.0)
MCHC: 33.2 g/dL (ref 30.0–36.0)
MCV: 77 fL — AB (ref 78.0–100.0)
MONO ABS: 0.4 10*3/uL (ref 0.1–1.0)
MONOS PCT: 9 %
Neutro Abs: 2.4 10*3/uL (ref 1.7–7.7)
Neutrophils Relative %: 54 %
PLATELETS: 270 10*3/uL (ref 150–400)
RBC: 4.74 MIL/uL (ref 3.87–5.11)
RDW: 21.3 % — ABNORMAL HIGH (ref 11.5–15.5)
WBC: 4.3 10*3/uL (ref 4.0–10.5)

## 2016-09-28 LAB — I-STAT TROPONIN, ED: Troponin i, poc: 0 ng/mL (ref 0.00–0.08)

## 2016-09-28 MED ORDER — IOPAMIDOL (ISOVUE-370) INJECTION 76%
INTRAVENOUS | Status: AC
Start: 2016-09-28 — End: 2016-09-28
  Administered 2016-09-28: 100 mL
  Filled 2016-09-28: qty 100

## 2016-09-28 NOTE — ED Provider Notes (Signed)
[facility] DEPT Provider Note   CSN: [id number] Arrival date & time: 09/28/16  [id number]     History   Chief Complaint Chief Complaint  Patient presents with  . Chest Pain    HPI Claudia Rivas is a 45 y.o. female.  HPI Patient reports intermittent anterior chest pain radiated into both shoulders worse with flexing the waste onset 7 days ago. She denies any shortness of breath. Pain may feel like pulmonary embolism she's been diagnosed with recently. She denies noncompliance with Xarelto. Denies further bleeding. Pain is improved with lying supine. She denies fever. Pain is not made worse with exertion. No nausea or sweatiness. No other associated symptoms. Pain has been constant since yesterday evening presently discomfort is mild to moderate described at 6/10. No treatment prior to coming here. No other associated symptoms Past Medical History:  Diagnosis Date  . Diverticulitis   . GERD (gastroesophageal reflux disease)   . Headache    "monthly" (08/17/2016)  . Iron deficiency anemia    received iron transfusion 08/17/2016  . Pneumonia 08/2009  . Pulmonary embolism ([place]) 08/16/2016  . Sickle cell trait [facility])     Patient Active Problem List   Diagnosis Date Noted  . Sickle cell trait ([place]) 09/15/2016  . Microcytic anemia 08/17/2016  . Hypokalemia 08/17/2016  . Iron deficiency anemia 08/17/2016  . GERD (gastroesophageal reflux disease)   . Pulmonary embolism with acute cor pulmonale ([place]) 08/16/2016    Past Surgical History:  Procedure Laterality Date  . NO PAST SURGERIES      OB History    No data available       Home Medications    Prior to Admission medications   Medication Sig Start Date End Date Taking? Authorizing Provider  folic acid (FOLVITE) 1 MG tablet Take 1 tablet (1 mg total) by mouth daily. 09/15/16   Eliezer Bottom, NP  iron polysaccharides (NIFEREX) 150 MG capsule Take 1 capsule (150 mg total) by mouth daily. 08/18/16   Eugenie Filler,  MD  pantoprazole (PROTONIX) 40 MG tablet Take 1 tablet (40 mg total) by mouth daily. 08/18/16   Eugenie Filler, MD  Rivaroxaban (XARELTO STARTER PACK) 15 & 20 MG TBPK Take as directed on package: Start with one 15mg  tablet by mouth twice a day with food. On Day 22, switch to one 20mg  tablet once a day with food. 08/18/16   Eugenie Filler, MD  rivaroxaban (XARELTO) 20 MG TABS tablet Take 1 tablet (20 mg total) by mouth daily with supper. 09/17/16   Eugenie Filler, MD    Family History Family History  Problem Relation Age of Onset  . Breast cancer Mother   . Crohn's disease Mother   . Crohn's disease Sister   . Diverticulitis Brother   Negative family history for coronary disease  Social History Social History  Substance Use Topics  . Smoking status: Never Smoker  . Smokeless tobacco: Never Used  . Alcohol use 1.8 oz/week    3 Glasses of wine per week     Allergies   Review of patient's allergies indicates no known allergies.   Review of Systems Review of Systems  Constitutional: Negative.   HENT: Negative.   Respiratory: Negative.   Cardiovascular: Positive for chest pain.  Gastrointestinal: Negative.   Genitourinary:       Irregular menses  Musculoskeletal: Negative.   Skin: Negative.   Neurological: Negative.   Psychiatric/Behavioral: Negative.   All other systems reviewed and are negative.  Physical Exam Updated Vital Signs BP 152/98   Pulse 82   Temp 98.1 F (36.7 C) (Oral)   Resp 21   Wt 160 lb (72.6 kg)   LMP 07/03/2016   SpO2 100%   BMI 30.23 kg/m   Physical Exam  Constitutional: She is oriented to person, place, and time. She appears well-developed and well-nourished. No distress.  HENT:  Head: Normocephalic and atraumatic.  Eyes: Conjunctivae are normal. Pupils are equal, round, and reactive to light.  Neck: Neck supple. No JVD present. No tracheal deviation present. No thyromegaly present.  Cardiovascular: Normal rate, regular rhythm,  normal heart sounds and intact distal pulses.  Exam reveals no friction rub.   No murmur heard. Pulmonary/Chest: Effort normal and breath sounds normal.  Abdominal: Soft. Bowel sounds are normal. She exhibits no distension. There is no tenderness.  Musculoskeletal: Normal range of motion. She exhibits no edema or tenderness.  Neurological: She is alert and oriented to person, place, and time. Coordination normal.  Skin: Skin is warm and dry. No rash noted.  Psychiatric: She has a normal mood and affect.  Nursing note and vitals reviewed.    ED Treatments / Results  Labs (all labs ordered are listed, but only abnormal results are displayed) Labs Reviewed - No data to display  EKG   Date: 09/28/2016  Rate: 80  Rhythm: normal sinus rhythm  QRS Axis: normal  Intervals: normal  ST/T Wae abnormalities: normal  Conduction Disutrbances: none  Narrative Interpretation: unremarkable   Results for orders placed or performed during the hospital encounter of 09/28/16  CBC with Differential/Platelet  Result Value Ref Range   WBC 4.3 4.0 - 10.5 K/uL   RBC 4.74 3.87 - 5.11 MIL/uL   Hemoglobin 12.1 12.0 - 15.0 g/dL   HCT 36.5 36.0 - 46.0 %   MCV 77.0 (L) 78.0 - 100.0 fL   MCH 25.5 (L) 26.0 - 34.0 pg   MCHC 33.2 30.0 - 36.0 g/dL   RDW 21.3 (H) 11.5 - 15.5 %   Platelets 270 150 - 400 K/uL   Neutrophils Relative % 54 %   Lymphocytes Relative 36 %   Monocytes Relative 9 %   Eosinophils Relative 1 %   Basophils Relative 0 %   Neutro Abs 2.4 1.7 - 7.7 K/uL   Lymphs Abs 1.5 0.7 - 4.0 K/uL   Monocytes Absolute 0.4 0.1 - 1.0 K/uL   Eosinophils Absolute 0.0 0.0 - 0.7 K/uL   Basophils Absolute 0.0 0.0 - 0.1 K/uL   RBC Morphology TARGET CELLS   I-stat chem 8, ed  Result Value Ref Range   Sodium 143 135 - 145 mmol/L   Potassium 3.9 3.5 - 5.1 mmol/L   Chloride 106 101 - 111 mmol/L   BUN 8 6 - 20 mg/dL   Creatinine, Ser 0.80 0.44 - 1.00 mg/dL   Glucose, Bld 99 65 - 99 mg/dL   Calcium, Ion  1.15 1.15 - 1.40 mmol/L   TCO2 25 0 - 100 mmol/L   Hemoglobin 13.9 12.0 - 15.0 g/dL   HCT 41.0 36.0 - 46.0 %  I-stat troponin, ED  Result Value Ref Range   Troponin i, poc 0.00 0.00 - 0.08 ng/mL   Comment 3           Ct Angio Chest Pe W Or Wo Contrast  Result Date: 09/28/2016 CLINICAL DATA:  Cp radiating to righ scapula and arm x 21 week, worse today, dx'd PE in September, on anti coagulation EXAM:  CT ANGIOGRAPHY CHEST WITH CONTRAST TECHNIQUE: Multidetector CT imaging of the chest was performed using the standard protocol during bolus administration of intravenous contrast. Multiplanar CT image reconstructions and MIPs were obtained to evaluate the vascular anatomy. CONTRAST:  100 cc Isovue 370 COMPARISON:  08/16/2016 FINDINGS: Cardiovascular: Aberrant right subclavian artery passes behind the esophagus. This can cause dysphagia lusoria. No filling defect is identified in the pulmonary arterial tree to suggest pulmonary embolus. Mild to moderate cardiomegaly. Dilute contrast medium in the systemic arterial tree. Mediastinum/Nodes: Subtle stranding in the anterior mediastinum, significance uncertain. Trace fluid in the superior pericardial recesses. Right subpectoral node 1.2 cm in short axis image 38/4, previously 1.3 cm. More lateral right subpectoral lymph node 1.4 cm in short axis on image 44/4, stable. Lungs/Pleura: Faint ground-glass opacities in the lung bases, probably from a combination of subsegmental atelectasis and small airways disease. Upper Abdomen: There seems to be abnormal density surrounding the common hepatic artery, concerning for porta hepatis adenopathy or mass. Musculoskeletal: Unremarkable Review of the MIP images confirms the above findings. IMPRESSION: 1. Persistent right subpectoral adenopathy along with adenopathy or mass in the porta hepatis surround the common hepatic artery. Malignancy such as lymphoma or metastatic disease is a distinct possibility. 2. No filling defect is  identified in the pulmonary arterial tree to suggest pulmonary embolus. 3. Aberrant right subclavian artery passes behind the esophagus. This can cause dysphagia lusoria. 4. Faint ground-glass opacities in the lung bases probably from subsegmental atelectasis and air trapping. 5. Mild to moderate cardiomegaly. Electronically Signed   By: Van Clines M.D.   On: 09/28/2016 11:06   Radiology No results found.  Procedures Procedures (including critical care time)  Medications Ordered in ED Medications - No data to display   Initial Impression / Assessment and Plan / ED Course  I have reviewed the triage vital signs and the nursing notes.  Pertinent labs & imaging results that were available during my care of the patient were reviewed by me and considered in my medical decision making (see chart for details).  Clinical Course    Old records reviewed. Patient reports that she had mammogram within the past few weeks which was normal. She is being followed by the hematology service for lymphadenopathy. CT angiography chest ordered to check for pericardial effusion extension of pulmonary embolism Declines pain medicine. Heart score equals 1. Patient has no cardiac risk factors. Plan patient instructed to call Dr.Ennever tomorrow for follow-up though she reports scheduled appointment for 12/15/2016. She is also instructed to call her primary care physician Dr. Benjamine Mola to arrange to get blood pressure rechecked within the next 3 weeks or Tylenol as needed for pain Final Clinical Impressions(s) / ED Diagnoses  Diagnoses #1 atypical chest pain #2 elevated blood pressure Final diagnoses:  None    New Prescriptions New Prescriptions   No medications on file     Orlie Dakin, MD 09/28/16 1233

## 2016-09-28 NOTE — ED Notes (Signed)
Placed patient into a gown and on the monitor 

## 2016-09-28 NOTE — ED Triage Notes (Signed)
Cp since last wed, off and on mid chest , rads to rt arm and back no n/v/sob

## 2016-09-28 NOTE — ED Notes (Signed)
Patient transported to CT 

## 2016-09-28 NOTE — Discharge Instructions (Signed)
Take Tylenol as directed for pain. Call Dr. Antonieta Pert office tomorrow . Tell office staff that you were seen here. Doctor Marin Olp may want to see before your scheduled appointment on January 4. Call Dr. Marveen Reeks office tomorrow to arrange to get your blood pressure rechecked within the next 3 weeks at her office. Today's blood pressure is elevated at 152/100.

## 2016-09-29 ENCOUNTER — Telehealth: Payer: Self-pay | Admitting: Family

## 2016-09-29 ENCOUNTER — Other Ambulatory Visit: Payer: Self-pay | Admitting: Family

## 2016-09-29 DIAGNOSIS — R0789 Other chest pain: Secondary | ICD-10-CM

## 2016-09-29 DIAGNOSIS — R599 Enlarged lymph nodes, unspecified: Secondary | ICD-10-CM

## 2016-09-29 DIAGNOSIS — R591 Generalized enlarged lymph nodes: Secondary | ICD-10-CM

## 2016-09-29 NOTE — Telephone Encounter (Signed)
Claudia Rivas was in the ED yesterday for c/o chest pain. Her cardiac work up was negative. Repeat CT angio showed persistent right subpectoral adenopathy along with adenopathy or mass in the porta hematic surround the common hepatic artery. I spoke with her this morning and the pain is much improved. We will plan to get a PET scan in a week or so to further evaluate the adenopathy. All questions were answered and she is in agreement with the plan. We will follow-up with her once we have the results of the scan.

## 2016-10-05 ENCOUNTER — Ambulatory Visit (HOSPITAL_COMMUNITY): Payer: Managed Care, Other (non HMO)

## 2016-10-20 ENCOUNTER — Other Ambulatory Visit: Payer: Self-pay | Admitting: *Deleted

## 2016-10-20 DIAGNOSIS — I2609 Other pulmonary embolism with acute cor pulmonale: Secondary | ICD-10-CM

## 2016-10-21 ENCOUNTER — Ambulatory Visit: Payer: Managed Care, Other (non HMO) | Admitting: Family

## 2016-10-21 ENCOUNTER — Ambulatory Visit (HOSPITAL_BASED_OUTPATIENT_CLINIC_OR_DEPARTMENT_OTHER): Payer: Managed Care, Other (non HMO) | Admitting: Hematology & Oncology

## 2016-10-21 ENCOUNTER — Other Ambulatory Visit (HOSPITAL_BASED_OUTPATIENT_CLINIC_OR_DEPARTMENT_OTHER): Payer: Managed Care, Other (non HMO)

## 2016-10-21 ENCOUNTER — Other Ambulatory Visit: Payer: Managed Care, Other (non HMO)

## 2016-10-21 ENCOUNTER — Other Ambulatory Visit: Payer: Self-pay | Admitting: *Deleted

## 2016-10-21 VITALS — BP 154/91 | HR 101 | Temp 98.0°F | Wt 162.0 lb

## 2016-10-21 DIAGNOSIS — R590 Localized enlarged lymph nodes: Secondary | ICD-10-CM | POA: Diagnosis not present

## 2016-10-21 DIAGNOSIS — I2609 Other pulmonary embolism with acute cor pulmonale: Secondary | ICD-10-CM

## 2016-10-21 DIAGNOSIS — I2699 Other pulmonary embolism without acute cor pulmonale: Secondary | ICD-10-CM

## 2016-10-21 DIAGNOSIS — D573 Sickle-cell trait: Secondary | ICD-10-CM

## 2016-10-21 DIAGNOSIS — I2602 Saddle embolus of pulmonary artery with acute cor pulmonale: Secondary | ICD-10-CM

## 2016-10-21 DIAGNOSIS — E611 Iron deficiency: Secondary | ICD-10-CM

## 2016-10-21 LAB — CMP (CANCER CENTER ONLY)
ALK PHOS: 71 U/L (ref 26–84)
ALT: 25 U/L (ref 10–47)
AST: 22 U/L (ref 11–38)
Albumin: 3.6 g/dL (ref 3.3–5.5)
BUN: 11 mg/dL (ref 7–22)
CO2: 26 mEq/L (ref 18–33)
Calcium: 9.4 mg/dL (ref 8.0–10.3)
Chloride: 110 mEq/L — ABNORMAL HIGH (ref 98–108)
Creat: 1.1 mg/dl (ref 0.6–1.2)
GLUCOSE: 154 mg/dL — AB (ref 73–118)
POTASSIUM: 3.4 meq/L (ref 3.3–4.7)
SODIUM: 139 meq/L (ref 128–145)
Total Bilirubin: 0.4 mg/dl (ref 0.20–1.60)
Total Protein: 7.7 g/dL (ref 6.4–8.1)

## 2016-10-21 LAB — CBC WITH DIFFERENTIAL (CANCER CENTER ONLY)
BASO#: 0 10*3/uL (ref 0.0–0.2)
BASO%: 0.1 % (ref 0.0–2.0)
EOS%: 0.8 % (ref 0.0–7.0)
Eosinophils Absolute: 0.1 10*3/uL (ref 0.0–0.5)
HCT: 36.2 % (ref 34.8–46.6)
HGB: 12.3 g/dL (ref 11.6–15.9)
LYMPH#: 2.6 10*3/uL (ref 0.9–3.3)
LYMPH%: 31.8 % (ref 14.0–48.0)
MCH: 26.4 pg (ref 26.0–34.0)
MCHC: 34 g/dL (ref 32.0–36.0)
MCV: 78 fL — AB (ref 81–101)
MONO#: 0.4 10*3/uL (ref 0.1–0.9)
MONO%: 4.7 % (ref 0.0–13.0)
NEUT#: 5.2 10*3/uL (ref 1.5–6.5)
NEUT%: 62.6 % (ref 39.6–80.0)
PLATELETS: 263 10*3/uL (ref 145–400)
RBC: 4.66 10*6/uL (ref 3.70–5.32)
RDW: 19.8 % — AB (ref 11.1–15.7)
WBC: 8.3 10*3/uL (ref 3.9–10.0)

## 2016-10-21 MED ORDER — PANTOPRAZOLE SODIUM 40 MG PO TBEC: 40.0000 mg | DELAYED_RELEASE_TABLET | Freq: Every day | ORAL | 3 refills | Status: DC

## 2016-10-21 MED ORDER — FOLIC ACID 1 MG PO TABS: 1.0000 mg | ORAL_TABLET | Freq: Every day | ORAL | 6 refills | Status: DC

## 2016-10-21 NOTE — Progress Notes (Signed)
Hematology and Oncology Follow Up Visit  Shalu Jankowski [id number] 1971/05/03 45 y.o. 10/21/2016   Principle Diagnosis:   Bilateral pulmonary emboli  Chronic subpectoral lymphadenopathy  Sickle Cell trait  Current Therapy:   Xarelto 20 mg by mouth daily-to complete full is therapy in September [id number] Folic Acid 1 mg po q day     Interim History:  Ms. Steinhart is back for follow-up. We saw her initially on October 3. At that point in time, we did a full workup on her. Her hypercoagulable studies were all normal.  We try to get a PET scan for the lymphadenopathy but her insurance company denied this.  She ended up in the emergency room on October 18. She had chest wall pain. Another CT angiogram showed no evidence of residual pulmonary emboli. She did have some persistent right subpectoral lymph nodes. Of course, the radiologist mentioned a possible mass surrounding the porta hepatis.  She still has intermittent chest discomfort. I would not imagine that she has any malignancy. I will not she may have sarcoid.  She is doing well with the Xarelto. She's had no bleeding area did  She's had no fever sweats or chills. She's had no change in bowel or bladder habits. She's had no hemoptysis. There's been no shortness of breath.  She is getting ready to go to [place]. Her daughter is graduating college up there. I told that she would be okay as long she takes the Xarelto. I told her to make sure she drinks a lot of water.  Currently, her performance status is ECOG 0.  Medications:  Current Outpatient Prescriptions:  .  acetaminophen (TYLENOL) 325 MG tablet, Take 650 mg by mouth every 6 (six) hours as needed for mild pain., Disp: , Rfl:  .  folic acid (FOLVITE) 1 MG tablet, Take 1 tablet (1 mg total) by mouth daily., Disp: 30 tablet, Rfl: 6 .  iron polysaccharides (NIFEREX) 150 MG capsule, Take 1 capsule (150 mg total) by mouth daily., Disp: 30 capsule, Rfl: 3 .   pantoprazole (PROTONIX) 40 MG tablet, Take 1 tablet (40 mg total) by mouth daily., Disp: 30 tablet, Rfl: 3 .  rivaroxaban (XARELTO) 20 MG TABS tablet, Take 1 tablet (20 mg total) by mouth daily with supper., Disp: 30 tablet, Rfl: 4  Allergies:  Allergies  Allergen Reactions  . Latex Rash    Past Medical History, Surgical history, Social history, and Family History were reviewed and updated.  Review of Systems: As above  Physical Exam:  weight is 162 lb (73.5 kg). Her oral temperature is 98 F (36.7 C). Her blood pressure is 154/91 (abnormal) and her pulse is 101 (abnormal).   Wt Readings from Last 3 Encounters:  10/21/16 162 lb (73.5 kg)  09/28/16 160 lb (72.6 kg)  09/18/16 158 lb (71.7 kg)     Well-developed and well-nourished African American female in no obvious distress. Head and neck exam shows no ocular or oral lesions. There are no palpable cervical or supraclavicular lymph nodes. Lungs are clear. Cardiac exam regular rate and rhythm with no murmurs, rubs or bruits. Abdomen is soft. She has good bowel sounds. There is no fluid wave. There is no palpable liver or spleen tip. Back exam shows no tenderness over the spine, ribs or hips. Extremities shows no clubbing, cyanosis or edema. She has good range of motion of her joints. She has no palpable venous cord. She is a negative Homans sign. Skin exam shows no rashes, ecchymoses or petechia. Neurological  exam shows no focal neurological deficits.  Lab Results  Component Value Date   WBC 8.3 10/21/2016   HGB 12.3 10/21/2016   HCT 36.2 10/21/2016   MCV 78 (L) 10/21/2016   PLT 263 10/21/2016     Chemistry      Component Value Date/Time   NA 139 10/21/2016 1518   NA 142 09/13/2016 1202   K 3.4 10/21/2016 1518   K 3.9 09/13/2016 1202   CL 110 (H) 10/21/2016 1518   CO2 26 10/21/2016 1518   CO2 25 09/13/2016 1202   BUN 11 10/21/2016 1518   BUN 7.8 09/13/2016 1202   CREATININE 1.1 10/21/2016 1518   CREATININE 0.8 09/13/2016  1202      Component Value Date/Time   CALCIUM 9.4 10/21/2016 1518   CALCIUM 9.4 09/13/2016 1202   ALKPHOS 71 10/21/2016 1518   ALKPHOS 72 09/13/2016 1202   AST 22 10/21/2016 1518   AST 24 09/13/2016 1202   ALT 25 10/21/2016 1518   ALT 28 09/13/2016 1202   BILITOT 0.40 10/21/2016 1518   BILITOT <0.30 09/13/2016 1202         Impression and Plan: Ms. Mato is A 45 year old African-American female with an idiopathic pulmonary embolism. She has resolution of this already.  I will like to keep her on 35-year-old for those anticoagulation. After that, then I would consider low-dose Xarelto for 1 year at 10 mg.  I still do not believe that this adenopathy is anything pathologic. I am not sure if this is what caused her discomfort.  We will have to follow this along.  I think we can probably get her back in 6 months. Maybe when we see her back, we will do her scans and see however thing looks with the lymphadenopathy. If there is any growth, then we probably can get a PET scan approved.  I reviewed the last scans with her. I spent about 35 minutes with she and her husband. They are both very nice.   Volanda Napoleon, MD 11/10/20175:17 PM

## 2016-10-24 LAB — IRON AND TIBC
%SAT: 20 % — AB (ref 21–57)
IRON: 70 ug/dL (ref 41–142)
TIBC: 359 ug/dL (ref 236–444)
UIBC: 289 ug/dL (ref 120–384)

## 2016-10-24 LAB — FERRITIN: Ferritin: 34 ng/ml (ref 9–269)

## 2016-12-02 ENCOUNTER — Other Ambulatory Visit: Payer: Self-pay

## 2016-12-02 DIAGNOSIS — D573 Sickle-cell trait: Secondary | ICD-10-CM

## 2016-12-02 MED ORDER — FOLIC ACID 1 MG PO TABS: 1.0000 mg | ORAL_TABLET | Freq: Every day | ORAL | 3 refills | Status: AC

## 2016-12-02 MED ORDER — POLYSACCHARIDE IRON COMPLEX 150 MG PO CAPS: 150.0000 mg | ORAL_CAPSULE | Freq: Every day | ORAL | 3 refills | Status: DC

## 2016-12-02 MED ORDER — PANTOPRAZOLE SODIUM 40 MG PO TBEC
40.0000 mg | DELAYED_RELEASE_TABLET | Freq: Every day | ORAL | 3 refills | Status: DC
Start: 2016-12-02 — End: 2016-12-06

## 2016-12-02 MED ORDER — RIVAROXABAN 20 MG PO TABS: 20.0000 mg | ORAL_TABLET | Freq: Every day | ORAL | 4 refills | Status: DC

## 2016-12-06 ENCOUNTER — Other Ambulatory Visit: Payer: Self-pay | Admitting: *Deleted

## 2016-12-06 MED ORDER — POLYSACCHARIDE IRON COMPLEX 150 MG PO CAPS: 150.0000 mg | ORAL_CAPSULE | Freq: Every day | ORAL | 3 refills | Status: DC

## 2016-12-06 MED ORDER — PANTOPRAZOLE SODIUM 40 MG PO TBEC: 40.0000 mg | DELAYED_RELEASE_TABLET | Freq: Every day | ORAL | 3 refills | Status: DC

## 2016-12-06 MED ORDER — RIVAROXABAN 20 MG PO TABS: 20.0000 mg | ORAL_TABLET | Freq: Every day | ORAL | 4 refills | Status: DC

## 2016-12-15 ENCOUNTER — Ambulatory Visit: Payer: Managed Care, Other (non HMO) | Admitting: Family

## 2016-12-15 ENCOUNTER — Other Ambulatory Visit (HOSPITAL_BASED_OUTPATIENT_CLINIC_OR_DEPARTMENT_OTHER): Payer: Managed Care, Other (non HMO)

## 2016-12-15 ENCOUNTER — Other Ambulatory Visit: Payer: Managed Care, Other (non HMO)

## 2016-12-21 ENCOUNTER — Other Ambulatory Visit: Payer: Self-pay | Admitting: *Deleted

## 2016-12-21 MED ORDER — POLYSACCHARIDE IRON COMPLEX 150 MG PO CAPS: 150.0000 mg | ORAL_CAPSULE | Freq: Every day | ORAL | 3 refills | Status: DC

## 2016-12-27 ENCOUNTER — Other Ambulatory Visit: Payer: Self-pay | Admitting: Family

## 2016-12-27 ENCOUNTER — Ambulatory Visit
Admission: RE | Admit: 2016-12-27 | Discharge: 2016-12-27 | Disposition: A | Discharge status: Home / Self Care | Payer: Managed Care, Other (non HMO) | Source: Ambulatory Visit | Attending: Family | Admitting: Family

## 2016-12-27 DIAGNOSIS — R590 Localized enlarged lymph nodes: Secondary | ICD-10-CM

## 2016-12-28 ENCOUNTER — Telehealth: Payer: Self-pay | Admitting: *Deleted

## 2016-12-28 NOTE — Telephone Encounter (Signed)
Patient is having a needle biopsy on Tuesday. She would like to know how to manage her Xarelto. Per Dr Marin Olp patient is to hold her Xarelto for 2 days prior to the biopsy and restart the day afterwards. She understands this.  Patient then asked if she could start walking in her treadmill. Dr Marin Olp is fine with this. Patient aware.

## 2017-01-02 ENCOUNTER — Other Ambulatory Visit: Payer: Self-pay | Admitting: Family

## 2017-01-02 DIAGNOSIS — R590 Localized enlarged lymph nodes: Secondary | ICD-10-CM

## 2017-01-03 ENCOUNTER — Ambulatory Visit
Admission: RE | Admit: 2017-01-03 | Discharge: 2017-01-03 | Disposition: A | Discharge status: Home / Self Care | Payer: Managed Care, Other (non HMO) | Source: Ambulatory Visit | Attending: Family | Admitting: Family

## 2017-01-03 DIAGNOSIS — R590 Localized enlarged lymph nodes: Secondary | ICD-10-CM

## 2017-01-05 ENCOUNTER — Emergency Department (HOSPITAL_BASED_OUTPATIENT_CLINIC_OR_DEPARTMENT_OTHER)
Admission: EM | Admit: 2017-01-05 | Discharge: 2017-01-06 | Disposition: A | Discharge status: Home / Self Care | Payer: Managed Care, Other (non HMO) | Attending: Emergency Medicine | Admitting: Emergency Medicine

## 2017-01-05 ENCOUNTER — Encounter (HOSPITAL_BASED_OUTPATIENT_CLINIC_OR_DEPARTMENT_OTHER): Payer: Self-pay | Admitting: *Deleted

## 2017-01-05 DIAGNOSIS — R509 Fever, unspecified: Secondary | ICD-10-CM | POA: Insufficient documentation

## 2017-01-05 DIAGNOSIS — R5381 Other malaise: Secondary | ICD-10-CM | POA: Insufficient documentation

## 2017-01-05 DIAGNOSIS — J111 Influenza due to unidentified influenza virus with other respiratory manifestations: Secondary | ICD-10-CM

## 2017-01-05 DIAGNOSIS — R51 Headache: Secondary | ICD-10-CM | POA: Diagnosis present

## 2017-01-05 DIAGNOSIS — R69 Illness, unspecified: Secondary | ICD-10-CM

## 2017-01-05 NOTE — ED Notes (Signed)
She can't swallow the round Tylenol we have here, so she will take her own Tylenol.

## 2017-01-05 NOTE — ED Triage Notes (Signed)
She stopped her Xarelto on Sunday prior to a breast biopsy. She started back on Wednesday and while at work. She has had a fever since then. Headache. Symptoms after the biopsy.

## 2017-01-05 NOTE — ED Notes (Signed)
Pt has been able to tolerate fluids in the lobby and states she feels better after her tylenol

## 2017-01-06 MED ORDER — OSELTAMIVIR PHOSPHATE 75 MG PO CAPS: 75.0000 mg | ORAL_CAPSULE | Freq: Two times a day (BID) | ORAL | 0 refills | Status: DC

## 2017-01-06 NOTE — ED Provider Notes (Signed)
[facility] Provider Note: Georgena Spurling, MD, FACEP  CSN: [id number] MRN: [id number] ARRIVAL: 01/05/17 at 2124 ROOM: [place]  Influenza   HISTORY OF PRESENT ILLNESS  Claudia Rivas is a 46 y.o. female one day history of flulike symptoms. Specifically she has had a headache, malaise, chills and low-grade fever. She has been taking Tylenol for her symptoms. She denies cough, shortness of breath, nasal congestion, nausea, vomiting or diarrhea. Symptoms have been moderate and improved with Tylenol.   Past Medical History:  Diagnosis Date  . Diverticulitis   . GERD (gastroesophageal reflux disease)   . Headache    "monthly" (08/17/2016)  . Iron deficiency anemia    received iron transfusion 08/17/2016  . Pneumonia 08/2009  . Pulmonary embolism ([place]) 08/16/2016  . Sickle cell trait [facility])     Past Surgical History:  Procedure Laterality Date  . NO PAST SURGERIES      Family History  Problem Relation Age of Onset  . Breast cancer Mother   . Crohn's disease Mother   . Crohn's disease Sister   . Diverticulitis Brother     Social History  Substance Use Topics  . Smoking status: Never Smoker  . Smokeless tobacco: Never Used  . Alcohol use 1.8 oz/week    3 Glasses of wine per week    Prior to Admission medications   Medication Sig Start Date End Date Taking? Authorizing Provider  acetaminophen (TYLENOL) 325 MG tablet Take 650 mg by mouth every 6 (six) hours as needed for mild pain.   Yes Historical Provider, MD  folic acid (FOLVITE) 1 MG tablet Take 1 tablet (1 mg total) by mouth daily. 12/02/16  Yes Volanda Napoleon, MD  iron polysaccharides (NIFEREX) 150 MG capsule Take 1 capsule (150 mg total) by mouth daily. 12/21/16  Yes Volanda Napoleon, MD  pantoprazole (PROTONIX) 40 MG tablet Take 1 tablet (40 mg total) by mouth daily. 12/06/16  Yes Volanda Napoleon, MD  rivaroxaban (XARELTO) 20 MG TABS tablet Take 1 tablet (20 mg total) by mouth  daily with supper. 12/06/16  Yes Volanda Napoleon, MD  oseltamivir (TAMIFLU) 75 MG capsule Take 1 capsule (75 mg total) by mouth every 12 (twelve) hours. 01/06/17   Shanon Rosser, MD    Allergies Latex   REVIEW OF SYSTEMS  Negative except as noted here or in the History of Present Illness.   PHYSICAL EXAMINATION  Initial Vital Signs Blood pressure 127/78, pulse 95, temperature 98.7 F (37.1 C), temperature source Oral, resp. rate 18, height 5\' 1"  (1.549 m), weight 162 lb (73.5 kg), last menstrual period 01/01/2017, SpO2 99 %.  Examination General: Well-developed, well-nourished female in no acute distress; appearance consistent with age of record HENT: normocephalic; atraumatic; no pharyngeal erythema or exudate; no nasal congestion Eyes: pupils equal, round and reactive to light; extraocular muscles intact Neck: supple Heart: regular rate and rhythhm Lungs: clear to auscultation bilaterally Abdomen: soft; nondistended; nontender; no masses or hepatosplenomegaly; bowel sounds present Extremities: No deformity; full range of motion; pulses normal Neurologic: Awake, alert and oriented; motor function intact in all extremities and symmetric; no facial droop Skin: Warm and dry Psychiatric: Normal mood and affect   RESULTS  Summary of this visit's results, reviewed by myself:   EKG Interpretation  Date/Time:    Ventricular Rate:    PR Interval:    QRS Duration:   QT Interval:    QTC Calculation:   R Axis:  Text Interpretation:        Laboratory Studies: No results found for this or any previous visit (from the past 24 hour(s)). Imaging Studies: No results found.  ED COURSE  Nursing notes and initial vitals signs, including pulse oximetry, reviewed.  Vitals:   01/05/17 2143 01/05/17 2145 01/05/17 2331  BP:  123/89 127/78  Pulse:  120 95  Resp:  20 18  Temp:  100.3 F (37.9 C) 98.7 F (37.1 C)  TempSrc:  Oral Oral  SpO2:  99% 99%  Weight: 162 lb (73.5 kg)      Height: 5\' 1"  (1.549 m)     Given patient's baseline medical problems will go ahead and start on Tamiflu.  PROCEDURES    ED DIAGNOSES     ICD-9-CM ICD-10-CM   1. Influenza-like illness 799.89 R69        Shanon Rosser, MD 01/06/17 [phone]

## 2017-03-22 ENCOUNTER — Other Ambulatory Visit: Payer: Self-pay | Admitting: Family

## 2017-03-22 DIAGNOSIS — R591 Generalized enlarged lymph nodes: Secondary | ICD-10-CM

## 2017-03-22 DIAGNOSIS — R599 Enlarged lymph nodes, unspecified: Secondary | ICD-10-CM

## 2017-03-23 ENCOUNTER — Ambulatory Visit (HOSPITAL_BASED_OUTPATIENT_CLINIC_OR_DEPARTMENT_OTHER): Payer: Managed Care, Other (non HMO)

## 2017-03-23 ENCOUNTER — Other Ambulatory Visit: Payer: Managed Care, Other (non HMO)

## 2017-03-23 ENCOUNTER — Ambulatory Visit: Payer: Managed Care, Other (non HMO) | Admitting: Family

## 2017-03-26 ENCOUNTER — Ambulatory Visit (HOSPITAL_BASED_OUTPATIENT_CLINIC_OR_DEPARTMENT_OTHER)
Admission: RE | Admit: 2017-03-26 | Discharge: 2017-03-26 | Disposition: A | Discharge status: Home / Self Care | Payer: Managed Care, Other (non HMO) | Source: Ambulatory Visit | Attending: Family | Admitting: Family

## 2017-03-26 ENCOUNTER — Encounter (HOSPITAL_BASED_OUTPATIENT_CLINIC_OR_DEPARTMENT_OTHER): Payer: Self-pay

## 2017-03-26 DIAGNOSIS — R591 Generalized enlarged lymph nodes: Secondary | ICD-10-CM | POA: Insufficient documentation

## 2017-03-26 DIAGNOSIS — R599 Enlarged lymph nodes, unspecified: Secondary | ICD-10-CM

## 2017-03-26 MED ORDER — IOPAMIDOL (ISOVUE-300) INJECTION 61%
100.0000 mL | Freq: Once | INTRAVENOUS | Status: AC | PRN
  Administered 2017-03-26: 80 mL via INTRAVENOUS

## 2017-03-29 ENCOUNTER — Other Ambulatory Visit: Payer: Self-pay | Admitting: Family

## 2017-03-29 ENCOUNTER — Telehealth: Payer: Self-pay | Admitting: Family

## 2017-03-29 DIAGNOSIS — R590 Localized enlarged lymph nodes: Secondary | ICD-10-CM

## 2017-03-29 NOTE — Telephone Encounter (Signed)
Spoke with patient and went over her CT scan result. She will reschedule cancelled appointment for in the next couple weeks. We will repeat her CT again in 6 months to re-evaluate adenopathy.

## 2017-04-07 ENCOUNTER — Ambulatory Visit: Payer: Managed Care, Other (non HMO) | Admitting: Family

## 2017-04-07 ENCOUNTER — Other Ambulatory Visit: Payer: Managed Care, Other (non HMO)

## 2017-06-23 ENCOUNTER — Other Ambulatory Visit: Payer: Self-pay | Admitting: *Deleted

## 2017-06-23 MED ORDER — POLYSACCHARIDE IRON COMPLEX 150 MG PO CAPS: 150.0000 mg | ORAL_CAPSULE | Freq: Every day | ORAL | 3 refills | Status: DC

## 2017-06-27 ENCOUNTER — Other Ambulatory Visit: Payer: Self-pay | Admitting: *Deleted

## 2017-06-27 MED ORDER — POLYSACCHARIDE IRON COMPLEX 150 MG PO CAPS: 150.0000 mg | ORAL_CAPSULE | Freq: Every day | ORAL | 3 refills | Status: DC

## 2017-07-03 ENCOUNTER — Other Ambulatory Visit: Payer: Self-pay | Admitting: *Deleted

## 2017-07-03 MED ORDER — POLYSACCHARIDE IRON COMPLEX 150 MG PO CAPS: 150.0000 mg | ORAL_CAPSULE | Freq: Every day | ORAL | 3 refills | Status: DC

## 2017-08-22 ENCOUNTER — Ambulatory Visit (HOSPITAL_BASED_OUTPATIENT_CLINIC_OR_DEPARTMENT_OTHER)
Admission: RE | Admit: 2017-08-22 | Discharge: 2017-08-22 | Disposition: A | Discharge status: Home / Self Care | Payer: Managed Care, Other (non HMO) | Source: Ambulatory Visit | Attending: Family | Admitting: Family

## 2017-08-22 ENCOUNTER — Encounter (HOSPITAL_BASED_OUTPATIENT_CLINIC_OR_DEPARTMENT_OTHER): Payer: Self-pay

## 2017-08-22 DIAGNOSIS — R590 Localized enlarged lymph nodes: Secondary | ICD-10-CM | POA: Diagnosis present

## 2017-08-22 MED ORDER — IOPAMIDOL (ISOVUE-300) INJECTION 61%
100.0000 mL | Freq: Once | INTRAVENOUS | Status: AC | PRN
  Administered 2017-08-22: 80 mL via INTRAVENOUS

## 2017-08-24 ENCOUNTER — Other Ambulatory Visit: Payer: Self-pay | Admitting: Family

## 2017-08-24 ENCOUNTER — Telehealth: Payer: Self-pay | Admitting: Family

## 2017-08-24 DIAGNOSIS — R591 Generalized enlarged lymph nodes: Secondary | ICD-10-CM

## 2017-08-24 DIAGNOSIS — R590 Localized enlarged lymph nodes: Secondary | ICD-10-CM

## 2017-08-24 NOTE — Telephone Encounter (Signed)
I spoke with Ms. Claudia Rivas and went over her CT scan results in detail. All questions were answered and she is in agreement with having a PET scan to further evaluate. Order is in and scheduling message was sent to [facility].

## 2017-08-30 ENCOUNTER — Other Ambulatory Visit (HOSPITAL_BASED_OUTPATIENT_CLINIC_OR_DEPARTMENT_OTHER): Payer: Managed Care, Other (non HMO)

## 2017-08-30 ENCOUNTER — Ambulatory Visit (HOSPITAL_BASED_OUTPATIENT_CLINIC_OR_DEPARTMENT_OTHER): Payer: Managed Care, Other (non HMO) | Admitting: Hematology & Oncology

## 2017-08-30 VITALS — BP 148/79 | HR 97 | Temp 97.5°F | Resp 20 | Wt 154.4 lb

## 2017-08-30 DIAGNOSIS — I2609 Other pulmonary embolism with acute cor pulmonale: Secondary | ICD-10-CM

## 2017-08-30 DIAGNOSIS — D508 Other iron deficiency anemias: Secondary | ICD-10-CM

## 2017-08-30 DIAGNOSIS — N921 Excessive and frequent menstruation with irregular cycle: Secondary | ICD-10-CM

## 2017-08-30 DIAGNOSIS — E611 Iron deficiency: Secondary | ICD-10-CM | POA: Diagnosis not present

## 2017-08-30 DIAGNOSIS — I2602 Saddle embolus of pulmonary artery with acute cor pulmonale: Secondary | ICD-10-CM

## 2017-08-30 DIAGNOSIS — Z86711 Personal history of pulmonary embolism: Secondary | ICD-10-CM | POA: Diagnosis not present

## 2017-08-30 DIAGNOSIS — R590 Localized enlarged lymph nodes: Secondary | ICD-10-CM

## 2017-08-30 DIAGNOSIS — D573 Sickle-cell trait: Secondary | ICD-10-CM

## 2017-08-30 DIAGNOSIS — R599 Enlarged lymph nodes, unspecified: Secondary | ICD-10-CM | POA: Diagnosis not present

## 2017-08-30 DIAGNOSIS — Z1231 Encounter for screening mammogram for malignant neoplasm of breast: Secondary | ICD-10-CM

## 2017-08-30 DIAGNOSIS — D500 Iron deficiency anemia secondary to blood loss (chronic): Secondary | ICD-10-CM

## 2017-08-30 LAB — CMP (CANCER CENTER ONLY)
ALBUMIN: 3.4 g/dL (ref 3.3–5.5)
ALK PHOS: 64 U/L (ref 26–84)
ALT: 18 U/L (ref 10–47)
AST: 25 U/L (ref 11–38)
BILIRUBIN TOTAL: 0.6 mg/dL (ref 0.20–1.60)
BUN, Bld: 10 mg/dL (ref 7–22)
CALCIUM: 8.9 mg/dL (ref 8.0–10.3)
CO2: 22 mEq/L (ref 18–33)
Chloride: 108 mEq/L (ref 98–108)
Creat: 0.9 mg/dl (ref 0.6–1.2)
Glucose, Bld: 150 mg/dL — ABNORMAL HIGH (ref 73–118)
Potassium: 3.4 mEq/L (ref 3.3–4.7)
Sodium: 138 mEq/L (ref 128–145)
Total Protein: 8 g/dL (ref 6.4–8.1)

## 2017-08-30 LAB — CBC WITH DIFFERENTIAL (CANCER CENTER ONLY)
BASO#: 0 10*3/uL (ref 0.0–0.2)
BASO%: 0.2 % (ref 0.0–2.0)
EOS%: 0.7 % (ref 0.0–7.0)
Eosinophils Absolute: 0 10*3/uL (ref 0.0–0.5)
HEMATOCRIT: 29.1 % — AB (ref 34.8–46.6)
HEMOGLOBIN: 9.2 g/dL — AB (ref 11.6–15.9)
LYMPH#: 1.8 10*3/uL (ref 0.9–3.3)
LYMPH%: 32.9 % (ref 14.0–48.0)
MCH: 20.1 pg — ABNORMAL LOW (ref 26.0–34.0)
MCHC: 31.6 g/dL — AB (ref 32.0–36.0)
MCV: 64 fL — ABNORMAL LOW (ref 81–101)
MONO#: 0.4 10*3/uL (ref 0.1–0.9)
MONO%: 6.4 % (ref 0.0–13.0)
NEUT%: 59.8 % (ref 39.6–80.0)
NEUTROS ABS: 3.3 10*3/uL (ref 1.5–6.5)
Platelets: 519 10*3/uL — ABNORMAL HIGH (ref 145–400)
RBC: 4.57 10*6/uL (ref 3.70–5.32)
RDW: 17.6 % — ABNORMAL HIGH (ref 11.1–15.7)
WBC: 5.5 10*3/uL (ref 3.9–10.0)

## 2017-08-30 LAB — IRON AND TIBC
%SAT: 5 % — ABNORMAL LOW (ref 21–57)
Iron: 21 ug/dL — ABNORMAL LOW (ref 41–142)
TIBC: 443 ug/dL (ref 236–444)
UIBC: 421 ug/dL — AB (ref 120–384)

## 2017-08-30 LAB — FERRITIN: Ferritin: 9 ng/ml (ref 9–269)

## 2017-08-30 LAB — LACTATE DEHYDROGENASE: LDH: 167 U/L (ref 125–245)

## 2017-08-30 MED ORDER — POLYSACCHARIDE IRON COMPLEX 150 MG PO CAPS: 150.0000 mg | ORAL_CAPSULE | Freq: Two times a day (BID) | ORAL | 3 refills | Status: DC

## 2017-08-30 MED ORDER — RIVAROXABAN 10 MG PO TABS: 10.0000 mg | ORAL_TABLET | Freq: Every day | ORAL | 3 refills | Status: DC

## 2017-08-30 NOTE — Progress Notes (Signed)
Hematology and Oncology Follow Up Visit  Claudia Rivas [id number] [birth year] 46 y.o. 08/30/2017   Principle Diagnosis:   Bilateral pulmonary emboli  Chronic subpectoral lymphadenopathy  Sickle Cell trait  Current Therapy:   Xarelto 20 mg by mouth daily-to complete full is therapy in September 2018 Xarelto 10 mg by mouth daily maintenance therapy-complete this in    September 5397  Folic Acid 1 mg po q day     Interim History:  Claudia Rivas is back for follow-up. We last saw her back in November of last year.  She's been having some right upper chest wall pain. She says this is intermittent.  She does not have any residual pulmonary emboli by her last CT angiogram that was done back in October of last year.  We did do a CT scan on her last week. This was a CT scan of the chest. The CT scan showed growth of a right subpectoral lymph node. An 8 mm lymph node measures 16 mm. A 7 mm subpectoral lymph node now measures 10 mm. There is no mediastinal or hilar adenopathy. She has a 2 mm stable pulmonary nodule.  Nothing is mentioned about any Porta hepatitis lymph nodes. Nothing is mentioned regarding her bones.  She is on Xarelto. She has been on a year of therapeutic Xarelto. I told her we can cut this back now to 10 mg daily.  She is having some bleeding problems. She apparently is on a progesterone only contraceptive. She sees her gynecologist. She has clearly iron deficiency. She has dropped her hemoglobin by 3 points. Her MCV is down to 64. Her iron studies show her ferritin to be 9 with iron saturation of only 5%. She is on Niferex. She takes this daily. She also is on Protonix. I told her to take Niferex twice a day. I told her to take vitamin C with this. The dose of vitamin C is 500 mg. I told her to stop the Protonix and take only as needed. Maybe, this will get her hemoglobin up and her iron levels up. If not, that she clearly will need IV iron.  She has been afraid to  travel anywhere. I told her that she can go wherever she wants. She just has to drink a lot of water and walk and exercise and stretch her legs every couple hours.  She's had no nausea or vomiting. This been no change in bowel or bladder habits. She's had no fever.  Overall, her performance status is ECOG 0.   Medications:  Current Outpatient Prescriptions:  .  acetaminophen (TYLENOL) 325 MG tablet, Take 650 mg by mouth every 6 (six) hours as needed for mild pain., Disp: , Rfl:  .  cholecalciferol (VITAMIN D) 1000 units tablet, Take 1,000 Units by mouth daily., Disp: , Rfl:  .  folic acid (FOLVITE) 1 MG tablet, Take 1 tablet (1 mg total) by mouth daily., Disp: 90 tablet, Rfl: 3 .  iron polysaccharides (FERREX 150) 150 MG capsule, Take 1 capsule (150 mg total) by mouth daily., Disp: 90 capsule, Rfl: 3 .  norethindrone (CAMILA) 0.35 MG tablet, TAKE 1 TABLET(0.35 MG) BY MOUTH DAILY, Disp: , Rfl:  .  pantoprazole (PROTONIX) 40 MG tablet, Take 1 tablet (40 mg total) by mouth daily., Disp: 90 tablet, Rfl: 3 .  rivaroxaban (XARELTO) 20 MG TABS tablet, Take 1 tablet (20 mg total) by mouth daily with supper., Disp: 90 tablet, Rfl: 4  Allergies:  Allergies  Allergen Reactions  . Latex  Rash    Past Medical History, Surgical history, Social history, and Family History were reviewed and updated.  Review of Systems: As stated in the interim history  Physical Exam:  weight is 154 lb 6.4 oz (70 kg). Her oral temperature is 97.5 F (36.4 C) (abnormal). Her blood pressure is 148/79 (abnormal) and her pulse is 97. Her respiration is 20 and oxygen saturation is 97%.   Wt Readings from Last 3 Encounters:  08/30/17 154 lb 6.4 oz (70 kg)  01/05/17 162 lb (73.5 kg)  10/21/16 162 lb (73.5 kg)     Physical Exam  Constitutional: She is oriented to person, place, and time.  HENT:  Head: Normocephalic and atraumatic.  Mouth/Throat: Oropharynx is clear and moist.  Eyes: Pupils are equal, round, and  reactive to light. EOM are normal.  Neck: Normal range of motion.  Cardiovascular: Normal rate, regular rhythm and normal heart sounds.   Pulmonary/Chest: Effort normal and breath sounds normal.  Abdominal: Soft. Bowel sounds are normal.  Musculoskeletal: Normal range of motion. She exhibits no edema, tenderness or deformity.  Lymphadenopathy:    She has no cervical adenopathy.  Neurological: She is alert and oriented to person, place, and time.  Skin: Skin is warm and dry. No rash noted. No erythema.  Psychiatric: She has a normal mood and affect. Her behavior is normal. Judgment and thought content normal.  Vitals reviewed.    Lab Results  Component Value Date   WBC 5.5 08/30/2017   HGB 9.2 (L) 08/30/2017   HCT 29.1 (L) 08/30/2017   MCV 64 (L) 08/30/2017   PLT 519 (H) 08/30/2017     Chemistry      Component Value Date/Time   NA 138 08/30/2017 0850   NA 142 09/13/2016 1202   K 3.4 08/30/2017 0850   K 3.9 09/13/2016 1202   CL 108 08/30/2017 0850   CO2 22 08/30/2017 0850   CO2 25 09/13/2016 1202   BUN 10 08/30/2017 0850   BUN 7.8 09/13/2016 1202   CREATININE 0.9 08/30/2017 0850   CREATININE 0.8 09/13/2016 1202      Component Value Date/Time   CALCIUM 8.9 08/30/2017 0850   CALCIUM 9.4 09/13/2016 1202   ALKPHOS 64 08/30/2017 0850   ALKPHOS 72 09/13/2016 1202   AST 25 08/30/2017 0850   AST 24 09/13/2016 1202   ALT 18 08/30/2017 0850   ALT 28 09/13/2016 1202   BILITOT 0.60 08/30/2017 0850   BILITOT <0.30 09/13/2016 1202         Impression and Plan: Claudia Rivas is a 46 year old African-American female. She had a pulmonary embolism. Her hypercoagulable studies were normal.  I'm not sure what to make of this lymphadenopathy in the right subpectoral area. I cannot imagine that this is malignant. However, we will get a PET scan. This is what radiology recommended.  I would think that given her age and her ethnic origin, that sarcoidosis would be likely.  She  is in need of a mammogram. Her last mammogram was a year ago. We will order this for her.  She will start Xarelto at 10 mg milligrams. This will be a maintenance dose for her.  Again, told that she concerning travel wherever she would like to go. To me, the most important issue is for her to drink a lot of water.  I will see her back in one month.  We spent about 40 minutes with she and her husband. It is always nice to talk with them.  Volanda Napoleon, MD 9/19/20189:47 AM

## 2017-08-31 LAB — ANGIOTENSIN CONVERTING ENZYME: Angio Convert Enzyme: 37 U/L (ref 14–82)

## 2017-09-05 ENCOUNTER — Encounter (HOSPITAL_COMMUNITY): Payer: Managed Care, Other (non HMO)

## 2017-09-27 ENCOUNTER — Other Ambulatory Visit: Payer: Managed Care, Other (non HMO)

## 2017-09-27 ENCOUNTER — Ambulatory Visit: Payer: Managed Care, Other (non HMO) | Admitting: Hematology & Oncology

## 2017-10-06 ENCOUNTER — Telehealth: Payer: Self-pay

## 2017-10-06 ENCOUNTER — Other Ambulatory Visit: Payer: Self-pay

## 2017-10-06 DIAGNOSIS — I2609 Other pulmonary embolism with acute cor pulmonale: Secondary | ICD-10-CM

## 2017-10-06 NOTE — Telephone Encounter (Signed)
Received call from pt requesting an appt with genetics counselor. Pt states her mother died "23 years ago" at an early age with breast cancer. She also has a maternal aunt and cousins with breast ca histories. Pt's daughter (age 48) was dx this week with triple negative ca. Requests urgent appt as she will be traveling to [place] to care for daughter during treatment. Per Judson Roch, NP ok to refer to genetics. Order completed. dph

## 2017-10-11 ENCOUNTER — Other Ambulatory Visit (HOSPITAL_BASED_OUTPATIENT_CLINIC_OR_DEPARTMENT_OTHER): Payer: Managed Care, Other (non HMO)

## 2017-10-11 ENCOUNTER — Ambulatory Visit (HOSPITAL_BASED_OUTPATIENT_CLINIC_OR_DEPARTMENT_OTHER): Payer: Managed Care, Other (non HMO) | Admitting: Family

## 2017-10-11 VITALS — BP 149/94 | HR 86 | Temp 98.2°F | Resp 18 | Wt 153.0 lb

## 2017-10-11 DIAGNOSIS — D500 Iron deficiency anemia secondary to blood loss (chronic): Secondary | ICD-10-CM

## 2017-10-11 DIAGNOSIS — D509 Iron deficiency anemia, unspecified: Secondary | ICD-10-CM

## 2017-10-11 DIAGNOSIS — Z1231 Encounter for screening mammogram for malignant neoplasm of breast: Secondary | ICD-10-CM

## 2017-10-11 DIAGNOSIS — I2609 Other pulmonary embolism with acute cor pulmonale: Secondary | ICD-10-CM

## 2017-10-11 DIAGNOSIS — Z86711 Personal history of pulmonary embolism: Secondary | ICD-10-CM | POA: Diagnosis not present

## 2017-10-11 DIAGNOSIS — N921 Excessive and frequent menstruation with irregular cycle: Secondary | ICD-10-CM

## 2017-10-11 DIAGNOSIS — D573 Sickle-cell trait: Secondary | ICD-10-CM | POA: Diagnosis not present

## 2017-10-11 LAB — CBC WITH DIFFERENTIAL (CANCER CENTER ONLY)
BASO#: 0 10*3/uL (ref 0.0–0.2)
BASO%: 0.2 % (ref 0.0–2.0)
EOS ABS: 0 10*3/uL (ref 0.0–0.5)
EOS%: 0.8 % (ref 0.0–7.0)
HEMATOCRIT: 34.3 % — AB (ref 34.8–46.6)
HEMOGLOBIN: 11.4 g/dL — AB (ref 11.6–15.9)
LYMPH#: 1.9 10*3/uL (ref 0.9–3.3)
LYMPH%: 39.3 % (ref 14.0–48.0)
MCH: 23.5 pg — AB (ref 26.0–34.0)
MCHC: 33.2 g/dL (ref 32.0–36.0)
MCV: 71 fL — ABNORMAL LOW (ref 81–101)
MONO#: 0.4 10*3/uL (ref 0.1–0.9)
MONO%: 8.2 % (ref 0.0–13.0)
NEUT%: 51.5 % (ref 39.6–80.0)
NEUTROS ABS: 2.5 10*3/uL (ref 1.5–6.5)
Platelets: 320 10*3/uL (ref 145–400)
RBC: 4.86 10*6/uL (ref 3.70–5.32)
RDW: 25.1 % — AB (ref 11.1–15.7)
WBC: 4.8 10*3/uL (ref 3.9–10.0)

## 2017-10-11 LAB — IRON AND TIBC
%SAT: 8 % — AB (ref 21–57)
IRON: 33 ug/dL — AB (ref 41–142)
TIBC: 403 ug/dL (ref 236–444)
UIBC: 370 ug/dL (ref 120–384)

## 2017-10-11 LAB — CMP (CANCER CENTER ONLY)
ALBUMIN: 3.6 g/dL (ref 3.3–5.5)
ALT(SGPT): 20 U/L (ref 10–47)
AST: 23 U/L (ref 11–38)
Alkaline Phosphatase: 61 U/L (ref 26–84)
BUN, Bld: 8 mg/dL (ref 7–22)
CALCIUM: 9.2 mg/dL (ref 8.0–10.3)
CHLORIDE: 111 meq/L — AB (ref 98–108)
CO2: 25 meq/L (ref 18–33)
Creat: 1.1 mg/dl (ref 0.6–1.2)
GLUCOSE: 98 mg/dL (ref 73–118)
Potassium: 4 mEq/L (ref 3.3–4.7)
Sodium: 143 mEq/L (ref 128–145)
Total Bilirubin: 0.5 mg/dl (ref 0.20–1.60)
Total Protein: 7.9 g/dL (ref 6.4–8.1)

## 2017-10-11 LAB — FERRITIN: Ferritin: 14 ng/ml (ref 9–269)

## 2017-10-11 NOTE — Progress Notes (Signed)
Hematology and Oncology Follow Up Visit  Claudia Rivas [id number] 14-Jan-1971 46 y.o. 10/11/2017   Principle Diagnosis:  Bilateral pulmonary emboli Chronic subpectoral lymphadenopathy Sickle Cell trait  Current Therapy:   Xarelto 10 mg by mouth daily maintenance therapy - will complete September 7253  Folic Acid 1 mg po q day   Interim History:  Claudia Rivas is here today for follow-up. She states that she is quite depressed over her 48 yo daughters recent diagnosis of triple negative breast cancer.  Her daughter is in [place] and has several more tests to complete before starting treatment. She is hoping to go up there to be with her but there is also a great deal of financial stress there as well. She is working with her HR department and work to figure out her options.  I gave her information for the [place] to find support groups. She would like an online support group. She will also contact her daughters hospital and find out what groups are available in that area.  She denies needing an antidepressant. If this changes she will follow-up with her PCP. She just needs someone to talk to. She will also try speaking with some ladies at church who has history of breast cancer.  We have referred her to genetic counseling as her daughter was BRCA 1 positive.  She is also due for her mammogram and we will try to work with her schedule to get this done before she leaves. She has had some tenderness at the 2 o'clock position of the right breast. No mass, lesion or rash noted on exam.  No fever, chills, n/v, cough, rash, dizziness, SOB, chest pain, palpitations, abdominal pain or changes in bowel or bladder habits.  No swelling, tenderness, numbness or tingling in her extremities.  Her appetite is down but she is hydrating. Her weight is stable.   ECOG Performance Status: 1 - Symptomatic but completely ambulatory  Medications:  Allergies as of 10/11/2017      Reactions    Latex Rash      Medication List       Accurate as of 10/11/17  9:15 AM. Always use your most recent med list.          acetaminophen 325 MG tablet Commonly known as:  TYLENOL Take 650 mg by mouth every 6 (six) hours as needed for mild pain.   cholecalciferol 1000 units tablet Commonly known as:  VITAMIN D Take 1,000 Units by mouth daily.   folic acid 1 MG tablet Commonly known as:  FOLVITE Take 1 tablet (1 mg total) by mouth daily.   iron polysaccharides 150 MG capsule Commonly known as:  FERREX 150 Take 1 capsule (150 mg total) by mouth 2 (two) times daily.   NORLYDA 0.35 MG tablet Generic drug:  norethindrone TAKE 1 TABLET BY MOUTH DAILY   pantoprazole 40 MG tablet Commonly known as:  PROTONIX Take 1 tablet (40 mg total) by mouth daily.   rivaroxaban 10 MG Tabs tablet Commonly known as:  XARELTO Take 1 tablet (10 mg total) by mouth daily with supper.       Allergies:  Allergies  Allergen Reactions  . Latex Rash    Past Medical History, Surgical history, Social history, and Family History were reviewed and updated.  Review of Systems: All other 10 point review of systems is negative.   Physical Exam:  vitals were not taken for this visit.  Wt Readings from Last 3 Encounters:  08/30/17 154 lb  6.4 oz (70 kg)  01/05/17 162 lb (73.5 kg)  10/21/16 162 lb (73.5 kg)    Ocular: Sclerae unicteric, pupils equal, round and reactive to light Ear-nose-throat: Oropharynx clear, dentition fair Lymphatic: No cervical, supraclavicular or axillary adenopathy Lungs no rales or rhonchi, good excursion bilaterally Heart regular rate and rhythm, no murmur appreciated Abd soft, nontender, positive bowel sounds, no liver or spleen tip palpated on exam, no fluid wave  MSK no focal spinal tenderness, no joint edema Neuro: non-focal, well-oriented, appropriate affect Breasts: Deferred   Lab Results  Component Value Date   WBC 4.8 10/11/2017   HGB 11.4 (L)  10/11/2017   HCT 34.3 (L) 10/11/2017   MCV 71 (L) 10/11/2017   PLT 320 10/11/2017   Lab Results  Component Value Date   FERRITIN 9 08/30/2017   IRON 21 (L) 08/30/2017   TIBC 443 08/30/2017   UIBC 421 (H) 08/30/2017   IRONPCTSAT 5 (L) 08/30/2017   Lab Results  Component Value Date   RETICCTPCT 1.1 08/17/2016   RBC 4.86 10/11/2017   No results found for: KPAFRELGTCHN, LAMBDASER, KAPLAMBRATIO No results found for: IGGSERUM, IGA, IGMSERUM No results found for: Odetta Pink, SPEI   Chemistry      Component Value Date/Time   NA 138 08/30/2017 0850   NA 142 09/13/2016 1202   K 3.4 08/30/2017 0850   K 3.9 09/13/2016 1202   CL 108 08/30/2017 0850   CO2 22 08/30/2017 0850   CO2 25 09/13/2016 1202   BUN 10 08/30/2017 0850   BUN 7.8 09/13/2016 1202   CREATININE 0.9 08/30/2017 0850   CREATININE 0.8 09/13/2016 1202      Component Value Date/Time   CALCIUM 8.9 08/30/2017 0850   CALCIUM 9.4 09/13/2016 1202   ALKPHOS 64 08/30/2017 0850   ALKPHOS 72 09/13/2016 1202   AST 25 08/30/2017 0850   AST 24 09/13/2016 1202   ALT 18 08/30/2017 0850   ALT 28 09/13/2016 1202   BILITOT 0.60 08/30/2017 0850   BILITOT <0.30 09/13/2016 1202      Impression and Plan: Claudia Rivas is a pleasant 46 yo African American female with history of PE, sickle cell trait and iron deficiency anemia. She is doing well on maintenance Xarelto and so far there has been no evidence of recurrence.  She is hydrating well and will continue to take her folic acid daily.  Unfortunately her biggest issue at this time is her daughters breast cancer diagnosis. She is very worried about this and quite depressed. She has a strong family history of breast cancer and will be following up with [facility] for further work up.  She will have her mammogram in November 2nd.  We will see what her results show.  We will plan to see her back in another 2 months for follow-up and  lab work.  She will contact our office with any questions or concerns. We can certainly see her sooner if need be.   Eliezer Bottom, NP 10/31/20189:15 AM

## 2017-10-12 LAB — RETICULOCYTES: RETICULOCYTE COUNT: 0.8 % (ref 0.6–2.6)

## 2017-10-13 ENCOUNTER — Telehealth: Payer: Self-pay | Admitting: *Deleted

## 2017-10-13 ENCOUNTER — Encounter (HOSPITAL_BASED_OUTPATIENT_CLINIC_OR_DEPARTMENT_OTHER): Payer: Self-pay

## 2017-10-13 ENCOUNTER — Other Ambulatory Visit: Payer: Self-pay | Admitting: Family

## 2017-10-13 ENCOUNTER — Other Ambulatory Visit: Payer: Self-pay | Admitting: Hematology & Oncology

## 2017-10-13 ENCOUNTER — Ambulatory Visit (HOSPITAL_BASED_OUTPATIENT_CLINIC_OR_DEPARTMENT_OTHER)
Admission: RE | Admit: 2017-10-13 | Discharge: 2017-10-13 | Disposition: A | Discharge status: Home / Self Care | Payer: Managed Care, Other (non HMO) | Source: Ambulatory Visit | Attending: Hematology & Oncology | Admitting: Hematology & Oncology

## 2017-10-13 DIAGNOSIS — D500 Iron deficiency anemia secondary to blood loss (chronic): Secondary | ICD-10-CM

## 2017-10-13 DIAGNOSIS — I2609 Other pulmonary embolism with acute cor pulmonale: Secondary | ICD-10-CM

## 2017-10-13 DIAGNOSIS — Z1231 Encounter for screening mammogram for malignant neoplasm of breast: Secondary | ICD-10-CM | POA: Diagnosis present

## 2017-10-13 DIAGNOSIS — R928 Other abnormal and inconclusive findings on diagnostic imaging of breast: Secondary | ICD-10-CM | POA: Diagnosis not present

## 2017-10-13 DIAGNOSIS — N6310 Unspecified lump in the right breast, unspecified quadrant: Secondary | ICD-10-CM | POA: Insufficient documentation

## 2017-10-13 DIAGNOSIS — N921 Excessive and frequent menstruation with irregular cycle: Secondary | ICD-10-CM

## 2017-10-13 NOTE — Telephone Encounter (Addendum)
Message left on personal voice mail with results and request to call the office back to discuss possible iron infusion.   Patient aware of results. Appointment made   ----- Message from Eliezer Bottom, NP sent at 10/11/2017  4:06 PM EDT ----- Regarding: Iron  Iron still quite low despite taking the Niferex BID. Does she want to try IV iron?  Sarah  ----- Message ----- From: Volanda Napoleon, MD Sent: 10/11/2017   3:57 PM To: Eliezer Bottom, NP    ----- Message ----- From: Interface, Lab In Three Zero One Sent: 10/11/2017   9:01 AM To: Volanda Napoleon, MD

## 2017-10-17 ENCOUNTER — Other Ambulatory Visit: Payer: Self-pay | Admitting: Hematology & Oncology

## 2017-10-17 DIAGNOSIS — R928 Other abnormal and inconclusive findings on diagnostic imaging of breast: Secondary | ICD-10-CM

## 2017-10-18 ENCOUNTER — Other Ambulatory Visit: Payer: Self-pay

## 2017-10-18 ENCOUNTER — Ambulatory Visit (HOSPITAL_BASED_OUTPATIENT_CLINIC_OR_DEPARTMENT_OTHER): Payer: Managed Care, Other (non HMO)

## 2017-10-18 VITALS — BP 134/78 | HR 68 | Temp 98.3°F | Resp 18

## 2017-10-18 DIAGNOSIS — D509 Iron deficiency anemia, unspecified: Secondary | ICD-10-CM | POA: Diagnosis not present

## 2017-10-18 DIAGNOSIS — D500 Iron deficiency anemia secondary to blood loss (chronic): Secondary | ICD-10-CM

## 2017-10-18 MED ORDER — SODIUM CHLORIDE 0.9 % IV SOLN
510.0000 mg | Freq: Once | INTRAVENOUS | Status: AC
  Administered 2017-10-18: 510 mg via INTRAVENOUS
  Filled 2017-10-18: qty 17

## 2017-10-18 MED ORDER — SODIUM CHLORIDE 0.9 % IV SOLN
Freq: Once | INTRAVENOUS | Status: AC
  Administered 2017-10-18: 15:00:00 via INTRAVENOUS

## 2017-10-18 NOTE — Patient Instructions (Signed)

## 2017-10-18 NOTE — Progress Notes (Signed)
Patient tolerated first feraheme infusion without complications.

## 2017-10-25 ENCOUNTER — Ambulatory Visit: Payer: Managed Care, Other (non HMO)

## 2017-10-30 ENCOUNTER — Other Ambulatory Visit: Payer: Managed Care, Other (non HMO)

## 2017-10-30 ENCOUNTER — Encounter: Payer: Managed Care, Other (non HMO) | Admitting: Genetic Counselor

## 2017-10-31 ENCOUNTER — Ambulatory Visit
Admission: RE | Admit: 2017-10-31 | Discharge: 2017-10-31 | Disposition: A | Discharge status: Home / Self Care | Payer: Managed Care, Other (non HMO) | Source: Ambulatory Visit | Attending: Hematology & Oncology | Admitting: Hematology & Oncology

## 2017-10-31 ENCOUNTER — Other Ambulatory Visit: Payer: Managed Care, Other (non HMO)

## 2017-10-31 ENCOUNTER — Encounter: Payer: Managed Care, Other (non HMO) | Admitting: Genetics

## 2017-10-31 DIAGNOSIS — R928 Other abnormal and inconclusive findings on diagnostic imaging of breast: Secondary | ICD-10-CM

## 2017-11-07 ENCOUNTER — Other Ambulatory Visit: Payer: Self-pay

## 2017-11-07 ENCOUNTER — Ambulatory Visit (HOSPITAL_BASED_OUTPATIENT_CLINIC_OR_DEPARTMENT_OTHER): Payer: Managed Care, Other (non HMO)

## 2017-11-07 VITALS — BP 122/71 | HR 78 | Temp 98.6°F | Resp 18

## 2017-11-07 DIAGNOSIS — D509 Iron deficiency anemia, unspecified: Secondary | ICD-10-CM | POA: Diagnosis not present

## 2017-11-07 DIAGNOSIS — D500 Iron deficiency anemia secondary to blood loss (chronic): Secondary | ICD-10-CM

## 2017-11-07 MED ORDER — SODIUM CHLORIDE 0.9% FLUSH
3.0000 mL | Freq: Once | INTRAVENOUS | Status: DC | PRN
  Filled 2017-11-07: qty 10

## 2017-11-07 MED ORDER — SODIUM CHLORIDE 0.9 % IV SOLN
Freq: Once | INTRAVENOUS | Status: AC
  Administered 2017-11-07: 08:00:00 via INTRAVENOUS

## 2017-11-07 MED ORDER — SODIUM CHLORIDE 0.9 % IV SOLN
510.0000 mg | Freq: Once | INTRAVENOUS | Status: AC
  Administered 2017-11-07: 510 mg via INTRAVENOUS
  Filled 2017-11-07: qty 17

## 2017-11-07 MED ORDER — SODIUM CHLORIDE 0.9% FLUSH
10.0000 mL | INTRAVENOUS | Status: DC | PRN
  Filled 2017-11-07: qty 10

## 2017-11-07 NOTE — Patient Instructions (Signed)

## 2017-11-08 ENCOUNTER — Other Ambulatory Visit: Payer: Managed Care, Other (non HMO)

## 2017-11-08 ENCOUNTER — Ambulatory Visit: Payer: Managed Care, Other (non HMO) | Admitting: Hematology & Oncology

## 2017-11-10 ENCOUNTER — Other Ambulatory Visit: Payer: Self-pay | Admitting: Hematology & Oncology

## 2017-12-08 ENCOUNTER — Encounter: Payer: Self-pay | Admitting: Genetic Counselor

## 2017-12-11 ENCOUNTER — Encounter: Payer: Self-pay | Admitting: Genetic Counselor

## 2017-12-11 ENCOUNTER — Ambulatory Visit (HOSPITAL_BASED_OUTPATIENT_CLINIC_OR_DEPARTMENT_OTHER): Payer: Managed Care, Other (non HMO) | Admitting: Genetic Counselor

## 2017-12-11 ENCOUNTER — Other Ambulatory Visit (HOSPITAL_BASED_OUTPATIENT_CLINIC_OR_DEPARTMENT_OTHER): Payer: Managed Care, Other (non HMO)

## 2017-12-11 DIAGNOSIS — D509 Iron deficiency anemia, unspecified: Secondary | ICD-10-CM | POA: Diagnosis not present

## 2017-12-11 DIAGNOSIS — D500 Iron deficiency anemia secondary to blood loss (chronic): Secondary | ICD-10-CM

## 2017-12-11 DIAGNOSIS — Z315 Encounter for genetic counseling: Secondary | ICD-10-CM

## 2017-12-11 DIAGNOSIS — I2609 Other pulmonary embolism with acute cor pulmonale: Secondary | ICD-10-CM

## 2017-12-11 DIAGNOSIS — D573 Sickle-cell trait: Secondary | ICD-10-CM

## 2017-12-11 DIAGNOSIS — Z8481 Family history of carrier of genetic disease: Secondary | ICD-10-CM | POA: Diagnosis not present

## 2017-12-11 DIAGNOSIS — Z808 Family history of malignant neoplasm of other organs or systems: Secondary | ICD-10-CM | POA: Diagnosis not present

## 2017-12-11 DIAGNOSIS — Z803 Family history of malignant neoplasm of breast: Secondary | ICD-10-CM | POA: Diagnosis not present

## 2017-12-11 LAB — COMPREHENSIVE METABOLIC PANEL
ALBUMIN: 3.9 g/dL (ref 3.5–5.0)
ALK PHOS: 70 U/L (ref 40–150)
ALT: 14 U/L (ref 0–55)
AST: 15 U/L (ref 5–34)
Anion Gap: 9 mEq/L (ref 3–11)
BILIRUBIN TOTAL: 0.43 mg/dL (ref 0.20–1.20)
BUN: 11.2 mg/dL (ref 7.0–26.0)
CALCIUM: 9.4 mg/dL (ref 8.4–10.4)
CO2: 25 mEq/L (ref 22–29)
CREATININE: 0.9 mg/dL (ref 0.6–1.1)
Chloride: 109 mEq/L (ref 98–109)
EGFR: >60 mL/min/{1.73_m2} (ref >60)
GLUCOSE: 118 mg/dL (ref 70–140)
Potassium: 4 mEq/L (ref 3.5–5.1)
Sodium: 143 mEq/L (ref 136–145)
TOTAL PROTEIN: 7.8 g/dL (ref 6.4–8.3)

## 2017-12-11 LAB — CBC WITH DIFFERENTIAL/PLATELET
BASO%: 0.2 % (ref 0.0–2.0)
BASOS ABS: 0 10*3/uL (ref 0.0–0.1)
EOS ABS: 0 10*3/uL (ref 0.0–0.5)
EOS%: 0.6 % (ref 0.0–7.0)
HCT: 39.5 % (ref 34.8–46.6)
HGB: 13.4 g/dL (ref 11.6–15.9)
LYMPH%: 30.9 % (ref 14.0–49.7)
MCH: 27.6 pg (ref 25.1–34.0)
MCHC: 33.9 g/dL (ref 31.5–36.0)
MCV: 81.3 fL (ref 79.5–101.0)
MONO#: 0.2 10*3/uL (ref 0.1–0.9)
MONO%: 4 % (ref 0.0–14.0)
NEUT#: 3.3 10*3/uL (ref 1.5–6.5)
NEUT%: 64.3 % (ref 38.4–76.8)
PLATELETS: 221 10*3/uL (ref 145–400)
RBC: 4.86 10*6/uL (ref 3.70–5.45)
RDW: 18.6 % — ABNORMAL HIGH (ref 11.2–14.5)
WBC: 5.1 10*3/uL (ref 3.9–10.3)
lymph#: 1.6 10*3/uL (ref 0.9–3.3)
nRBC: 0 % (ref 0–0)

## 2017-12-11 LAB — RETICULOCYTES: RETICULOCYTE COUNT: 1 % (ref 0.6–2.6)

## 2017-12-11 LAB — IRON AND TIBC
%SAT: 39 % (ref 21–57)
Iron: 109 ug/dL (ref 41–142)
TIBC: 279 ug/dL (ref 236–444)
UIBC: 169 ug/dL (ref 120–384)

## 2017-12-11 LAB — FERRITIN: Ferritin: 351 ng/ml — ABNORMAL HIGH (ref 9–269)

## 2017-12-11 NOTE — Progress Notes (Signed)
REFERRING PROVIDER: Eliezer Bottom, NP [address] [place], [place] [address]  PRIMARY PROVIDER:  Garlan Fillers, MD  PRIMARY REASON FOR VISIT:  1. Family history of breast cancer   2. Family history of BRCA1 gene positive      HISTORY OF PRESENT ILLNESS:   Claudia Rivas, a 46 y.o. female, was seen for a [place] cancer genetics consultation at the request of Dr. Vergia Alcon due to a family history of cancer.  Claudia Rivas presents to clinic today to discuss the possibility of a hereditary predisposition to cancer, genetic testing, and to further clarify her future cancer risks, as well as potential cancer risks for family members. Claudia Rivas is a 46 y.o. female with no personal history of cancer.  She reports having two biopsies, but has never had a diagnosis of breast cancer.  Claudia Rivas daughter was recently diagnosed with triple negative breast cancer and was found to have a BRCA1 mutation (see report attached to the bottom of the page).  Claudia Rivas reports that other family members, including her mother and aunts have tested positive for "the breast cancer gene".   CANCER HISTORY:   No history exists.     HORMONAL RISK FACTORS:  Menarche was at age 58.  First live birth at age 71.  OCP use for approximately 31 years.  Ovaries intact: yes.  Hysterectomy: no.  Menopausal status: premenopausal.  HRT use: 0 years. Colonoscopy: yes; has a few colon polyps and diverticulitis. Mammogram within the last year: yes. Number of breast biopsies: 2. Up to date with pelvic exams:  yes. Any excessive radiation exposure in the past:  no  Past Medical History:  Diagnosis Date  . Diverticulitis   . Family history of BRCA1 gene positive   . Family history of breast cancer   . GERD (gastroesophageal reflux disease)   . Headache    "monthly" (08/17/2016)  . Iron deficiency anemia    received iron transfusion 08/17/2016  . Pneumonia 08/2009  . Pulmonary  embolism ([place]) 08/16/2016  . Sickle cell trait [facility])     Past Surgical History:  Procedure Laterality Date  . BREAST BIOPSY Right   . NO PAST SURGERIES      Social History   Socioeconomic History  . Marital status: Married    Spouse name: Not on file  . Number of children: Not on file  . Years of education: Not on file  . Highest education level: Not on file  Social Needs  . Financial resource strain: Not on file  . Food insecurity - worry: Not on file  . Food insecurity - inability: Not on file  . Transportation needs - medical: Not on file  . Transportation needs - non-medical: Not on file  Occupational History  . Not on file  Tobacco Use  . Smoking status: Never Smoker  . Smokeless tobacco: Never Used  Substance and Sexual Activity  . Alcohol use: Yes    Alcohol/week: 1.8 oz    Types: 3 Glasses of wine per week  . Drug use: No  . Sexual activity: Yes    Birth control/protection: Pill  Other Topics Concern  . Not on file  Social History Narrative  . Not on file     FAMILY HISTORY:  We obtained a detailed, 4-generation family history.  Significant diagnoses are listed below: Family History  Problem Relation Age of Onset  . Crohn's disease Mother   . Breast cancer Mother 53  recurrance at 18, died at 69  . BRCA 1/2 Mother   . Crohn's disease Sister   . Diverticulitis Brother   . Breast cancer Daughter 36  . BRCA 1/2 Daughter        BRCA1 [id number] (Splice donor)  . Breast cancer Maternal Aunt 91  . BRCA 1/2 Maternal Aunt   . Breast cancer Maternal Aunt 51       dx stage IV, lived 3 more weeks  . Heart attack Maternal Grandmother   . Cervical cancer Sister 49       pat 1/2 sister, died at 53    The patient has three daughters, one who was diagnosed with triple negative breast cancer at age 30.  This daughter was found to have a BRCA1 mutation.  The patient has two full brothers, a maternal half brother and sister and a paternal half brother and  sister.  Her paternal half sister was diagnosed with cervical cancer at age 13 and died at 70.  Her maternal half sister reportedly tested negative for the BRCA1 hereditary mutation.  The patient's mother is deceased and her father is alive.  The patient's father is in his 52's and has never had cancer.  The patient is not in contact with him and therefore is not well informed of his family.  She reports that he has about 11 siblings, one uncle has an unknown form of cancer.  She is unaware of cousins with cancer.  Both paternal grandparents are deceased.  The patient's mother had breast cancer at 66 and again at 51.  She died at 50.  The patient's mother reportedly had a BRCA1 mutation as well.  Her mother had three sisters.  One sister had breast cancer at 87 and again at 31, and another sister was diagnosed with stage IV breast cancer at 48 and died soon afterward.  The maternal grandparents are deceased from non cancer related issues, however the grandfather had several sisters with breast cancer.  Patient's maternal ancestors are of [place] American descent, and paternal ancestors are of [place] American and Native American descent. There is no reported Ashkenazi Jewish ancestry. There is no known consanguinity.  GENETIC COUNSELING ASSESSMENT: Claudia Rivas is a 46 y.o. female with a family history of breast cancer and a known family mutation in [place] which is somewhat suggestive of a hereditary cancer syndrome and predisposition to cancer. We, therefore, discussed and recommended the following at today's visit.   DISCUSSION: We discussed that about 5-10% of breast cancer is hereditary with most cases due to BRCA mutations.  Other genes associated with hereditary breast cancer syndromes, include ATM, CHEK2 and PALB2.  The patient's daughter was found to have a BRCA1 mutation, and the patient reports that her mother had a BRCA1 mutation as well.  We discussed that the only way that her mother and  daughter could both have the same mutation was if the patient herself also carried this.  Therefore, this test is more likely to be confirmatory for the familial BRCA1 mutation.  The patient reports that this is the first time this has been mentioned to her, and seemed surprised by this information.    We discussed that testing will reveal whether this is something that is coming from her side of the family.  Additionally, her aunt had genetic testing that she was unsure was of the same gene.  We reviewed that about 6% of patients have two hereditary mutations, and therefore it would not be unreasonable  to offer testing for additional genes.  We reviewed the characteristics, features and inheritance patterns of hereditary cancer syndromes. We also discussed genetic testing, including the appropriate family members to test, the process of testing, insurance coverage and turn-around-time for results. We discussed the implications of a negative, positive and/or variant of uncertain significant result. We recommended Claudia Rivas pursue genetic testing for the Multi cancer gene panel. The Multi-Gene Panel offered by [facility] includes sequencing and/or deletion duplication testing of the following 83 genes: ALK, APC, ATM, AXIN2,BAP1,  BARD1, BLM, BMPR1A, BRCA1, BRCA2, BRIP1, CASR, CDC73, CDH1, CDK4, CDKN1B, CDKN1C, CDKN2A (p14ARF), CDKN2A (p16INK4a), CEBPA, CHEK2, CTNNA1, DICER1, DIS3L2, EGFR (c.2369C>T, p.Thr790Met variant only), EPCAM (Deletion/duplication testing only), FH, FLCN, GATA2, GPC3, GREM1 (Promoter region deletion/duplication testing only), HOXB13 (c.251G>A, p.Gly84Glu), HRAS, KIT, MAX, MEN1, MET, MITF (c.952G>A, p.Glu318Lys variant only), MLH1, MSH2, MSH3, MSH6, MUTYH, NBN, NF1, NF2, NTHL1, PALB2, PDGFRA, PHOX2B, PMS2, POLD1, POLE, POT1, PRKAR1A, PTCH1, PTEN, RAD50, RAD51C, RAD51D, RB1, RECQL4, RET, RUNX1, SDHAF2, SDHA (sequence changes only), SDHB, SDHC, SDHD, SMAD4, SMARCA4, SMARCB1, SMARCE1, STK11,  SUFU, TERT, TERT, TMEM127, TP53, TSC1, TSC2, VHL, WRN and WT1.    Based on Claudia Rivas's family history of cancer, she meets medical criteria for genetic testing. Despite that she meets criteria, she may still have an out of pocket cost. We discussed that if her out of pocket cost for testing is over $100, the laboratory will call and confirm whether she wants to proceed with testing.  If the out of pocket cost of testing is less than $100 she will be billed by the genetic testing laboratory.   PLAN: After considering the risks, benefits, and limitations, Claudia Rivas  provided informed consent to pursue genetic testing and the blood sample was [facility] to [facility] for analysis of the Multi cancer gene panel. Results should be available within approximately 2-3 weeks' time, at which point they will be disclosed by telephone to Claudia Rivas, as will any additional recommendations warranted by these results. Claudia Rivas will receive a summary of her genetic counseling visit and a copy of her results once available. This information will also be available in Epic. We encouraged Claudia Rivas to remain in contact with cancer genetics annually so that we can continuously update the family history and inform her of any changes in cancer genetics and testing that may be of benefit for her family. Claudia Rivas questions were answered to her satisfaction today. Our contact information was provided should additional questions or concerns arise.  Lastly, we encouraged Claudia Rivas to remain in contact with cancer genetics annually so that we can continuously update the family history and inform her of any changes in cancer genetics and testing that may be of benefit for this family.   Ms.  Rivas questions were answered to her satisfaction today. Our contact information was provided should additional questions or concerns arise. Thank you for the referral and allowing [place]  to share in the care of your patient.   Florena Kozma P. Florene Glen, [place], [facility] Certified Genetic Counselor [email or web address] phone: [phone]  The patient was seen for a total of 60 minutes in face-to-face genetic counseling.  This patient was discussed with Drs. Magrinat, Lindi Adie and/or Burr Medico who agrees with the above.    _______________________________________________________________________ For Office Staff:  Number of people involved in session: 2 Was an Intern/ student involved with case: no  DAUGHTER'S TEST RESULT

## 2017-12-15 ENCOUNTER — Other Ambulatory Visit: Payer: Self-pay | Admitting: Family

## 2017-12-18 ENCOUNTER — Encounter: Payer: Self-pay | Admitting: Family

## 2017-12-18 ENCOUNTER — Inpatient Hospital Stay: Payer: Managed Care, Other (non HMO)

## 2017-12-18 ENCOUNTER — Other Ambulatory Visit: Payer: Self-pay

## 2017-12-18 ENCOUNTER — Inpatient Hospital Stay: Payer: Managed Care, Other (non HMO) | Attending: Family | Admitting: Family

## 2017-12-18 VITALS — BP 119/80 | HR 79 | Temp 98.5°F | Resp 16 | Wt 158.0 lb

## 2017-12-18 DIAGNOSIS — D573 Sickle-cell trait: Secondary | ICD-10-CM | POA: Diagnosis not present

## 2017-12-18 DIAGNOSIS — R599 Enlarged lymph nodes, unspecified: Secondary | ICD-10-CM | POA: Diagnosis not present

## 2017-12-18 DIAGNOSIS — I2609 Other pulmonary embolism with acute cor pulmonale: Secondary | ICD-10-CM | POA: Diagnosis not present

## 2017-12-18 DIAGNOSIS — Z1501 Genetic susceptibility to malignant neoplasm of breast: Secondary | ICD-10-CM | POA: Diagnosis not present

## 2017-12-18 DIAGNOSIS — Z7901 Long term (current) use of anticoagulants: Secondary | ICD-10-CM | POA: Insufficient documentation

## 2017-12-18 DIAGNOSIS — D509 Iron deficiency anemia, unspecified: Secondary | ICD-10-CM | POA: Insufficient documentation

## 2017-12-18 DIAGNOSIS — D500 Iron deficiency anemia secondary to blood loss (chronic): Secondary | ICD-10-CM

## 2017-12-18 DIAGNOSIS — Z803 Family history of malignant neoplasm of breast: Secondary | ICD-10-CM | POA: Insufficient documentation

## 2017-12-18 NOTE — Progress Notes (Signed)
Hematology and Oncology Follow Up Visit  Claudia Rivas [id number] [birth year] 47 y.o. 12/18/2017   Principle Diagnosis:  Bilateral pulmonary emboli Chronic subpectoral lymphadenopathy Sickle Cell trait Iron deficiency anemia   Current Therapy:   Xarelto 10 mg by mouth daily maintenance therapy - will complete September 1740  Folic Acid 1 mg po q day IV iron as indicated - last received in November 2018 x 2   Interim History:  Claudia Rivas is here today for follow-up. She received 2 doses of IV iron in November and is feeling much better. Her symptoms have resolved. Her energy has improved.  She has also been to see genetics and her lab results are pending. Her daughter will finish chemo next week and her lumpectomy is scheduled for February 5th.  She has been quite busy going back and forth to [place] to be with her daughter. This has caused some stress but she feels this is better.  No fever, chills, n/v, cough, rash, dizziness, SOB, chest pain, palpitations, abdominal pain or changes in bowel or bladder habits.  She has had no swelling, tenderness, numbness or tingling in her extremities.  No episodes of bleeding, no bruising or petechiae. No lymphadenopathy found on exam.  She has maintained a good appetite and is staying well hydrated. Her weight is stable.   ECOG Performance Status: 0 - Asymptomatic  Medications:  Allergies as of 12/18/2017      Reactions   Latex Rash      Medication List        Accurate as of 12/18/17  9:14 AM. Always use your most recent med list.          acetaminophen 325 MG tablet Commonly known as:  TYLENOL Take 650 mg by mouth every 6 (six) hours as needed for mild pain.   cholecalciferol 1000 units tablet Commonly known as:  VITAMIN D Take 1,000 Units by mouth daily.   folic acid 1 MG tablet Commonly known as:  FOLVITE Take 1 tablet (1 mg total) by mouth daily.   iron polysaccharides 150 MG capsule Commonly known as:  FERREX  150 Take 1 capsule (150 mg total) by mouth 2 (two) times daily.   NORLYDA 0.35 MG tablet Generic drug:  norethindrone TAKE 1 TABLET BY MOUTH DAILY   pantoprazole 40 MG tablet Commonly known as:  PROTONIX TAKE 1 TABLET DAILY   rivaroxaban 10 MG Tabs tablet Commonly known as:  XARELTO Take 1 tablet (10 mg total) by mouth daily with supper.       Allergies:  Allergies  Allergen Reactions  . Latex Rash    Past Medical History, Surgical history, Social history, and Family History were reviewed and updated.  Review of Systems: All other 10 point review of systems is negative.   Physical Exam:  weight is 158 lb (71.7 kg). Her oral temperature is 98.5 F (36.9 C). Her blood pressure is 119/80 and her pulse is 79. Her respiration is 16 and oxygen saturation is 100%.   Wt Readings from Last 3 Encounters:  12/18/17 158 lb (71.7 kg)  10/11/17 153 lb (69.4 kg)  08/30/17 154 lb 6.4 oz (70 kg)    Ocular: Sclerae unicteric, pupils equal, round and reactive to light Ear-nose-throat: Oropharynx clear, dentition fair Lymphatic: No cervical, supraclavicular or axillary adenopathy Lungs no rales or rhonchi, good excursion bilaterally Heart regular rate and rhythm, no murmur appreciated Abd soft, nontender, positive bowel sounds, no liver or spleen tip palpated on exam, no fluid wave  MSK no focal spinal tenderness, no joint edema Neuro: non-focal, well-oriented, appropriate affect Breasts: Deferred   Lab Results  Component Value Date   WBC 5.1 12/11/2017   HGB 13.4 12/11/2017   HCT 39.5 12/11/2017   MCV 81.3 12/11/2017   PLT 221 12/11/2017   Lab Results  Component Value Date   FERRITIN 351 (H) 12/11/2017   IRON 109 12/11/2017   TIBC 279 12/11/2017   UIBC 169 12/11/2017   IRONPCTSAT 39 12/11/2017   Lab Results  Component Value Date   RETICCTPCT 1.1 08/17/2016   RBC 4.86 12/11/2017   No results found for: KPAFRELGTCHN, LAMBDASER, KAPLAMBRATIO No results found for:  IGGSERUM, IGA, IGMSERUM No results found for: Odetta Pink, SPEI   Chemistry      Component Value Date/Time   NA 143 12/11/2017 1011   K 4.0 12/11/2017 1011   CL 111 (H) 10/11/2017 0847   CO2 25 12/11/2017 1011   BUN 11.2 12/11/2017 1011   CREATININE 0.9 12/11/2017 1011      Component Value Date/Time   CALCIUM 9.4 12/11/2017 1011   ALKPHOS 70 12/11/2017 1011   AST 15 12/11/2017 1011   ALT 14 12/11/2017 1011   BILITOT 0.43 12/11/2017 1011      Impression and Plan: Claudia Rivas is a very pleasant 47 yo African American female with history of PE, sickle cell trait and iron deficiency anemia. She continues to do well on maintenance Xarelto and folic acid, taken daily as prescribed.  She received 2 doses of IV iron in November and has had a nice response. Iron studies are stable.  She has a strong family history of breast cancer and has met with our genetics counselor. She is awaiting the results to her lab work.   We will plan to see her back in another 3 months for follow-up and lab work.  She will contact our office with any questions or concerns. We can certainly see her sooner if need be.   Claudia Peace, NP 1/7/20199:14 AM

## 2017-12-19 ENCOUNTER — Telehealth: Payer: Self-pay | Admitting: Genetic Counselor

## 2017-12-19 ENCOUNTER — Encounter: Payer: Self-pay | Admitting: Genetic Counselor

## 2017-12-19 DIAGNOSIS — Z1379 Encounter for other screening for genetic and chromosomal anomalies: Secondary | ICD-10-CM | POA: Insufficient documentation

## 2017-12-19 DIAGNOSIS — Z1509 Genetic susceptibility to other malignant neoplasm: Secondary | ICD-10-CM

## 2017-12-19 DIAGNOSIS — Z1501 Genetic susceptibility to malignant neoplasm of breast: Secondary | ICD-10-CM | POA: Insufficient documentation

## 2017-12-19 NOTE — Telephone Encounter (Signed)
Revealed that her tests confirmed that she also has the BRCA1 mutation identified in her family.  Patient will come in tomorrow at 8 AM to discuss further.

## 2017-12-19 NOTE — Telephone Encounter (Signed)
LM on VM that results were back.  Asked that she CB.

## 2017-12-20 ENCOUNTER — Ambulatory Visit: Payer: Managed Care, Other (non HMO) | Admitting: Genetic Counselor

## 2017-12-20 DIAGNOSIS — Z1379 Encounter for other screening for genetic and chromosomal anomalies: Secondary | ICD-10-CM

## 2017-12-20 DIAGNOSIS — Z1501 Genetic susceptibility to malignant neoplasm of breast: Secondary | ICD-10-CM

## 2017-12-20 DIAGNOSIS — Z1509 Genetic susceptibility to other malignant neoplasm: Secondary | ICD-10-CM

## 2017-12-20 DIAGNOSIS — Z803 Family history of malignant neoplasm of breast: Secondary | ICD-10-CM

## 2017-12-20 DIAGNOSIS — Z8481 Family history of carrier of genetic disease: Secondary | ICD-10-CM

## 2017-12-20 NOTE — Progress Notes (Addendum)
GENETIC TEST RESULTS   Patient Name: Claudia Rivas Patient Age: 47 y.o. Encounter Date: 12/20/2017  Referring Provider: Laverna Peace, NP  Burney Gauze, MD    Claudia Rivas was seen in the [place] clinic on December 20, 2017 due to a family history of cancer and an identified BRCA1 hereditary mutation. Please refer to the prior Genetics clinic note for more information regarding Claudia Rivas's medical and family histories and our assessment at the time.   FAMILY HISTORY:  We obtained a detailed, 4-generation family history.  Significant diagnoses are listed below: Family History  Problem Relation Age of Onset  . Crohn's disease Mother   . Breast cancer Mother 26       recurrance at 45, died at 29  . BRCA 1/2 Mother   . Crohn's disease Sister   . Diverticulitis Brother   . Breast cancer Daughter 54  . BRCA 1/2 Daughter        BRCA1 V.[id number]>Y (Splice donor)  . Breast cancer Maternal Aunt 39  . BRCA 1/2 Maternal Aunt   . Breast cancer Maternal Aunt 53       dx stage IV, lived 3 more weeks  . Heart attack Maternal Grandmother   . Cervical cancer Sister 43       pat 1/2 sister, died at 38    The patient has three daughters, one who was diagnosed with triple negative breast cancer at age 38.  This daughter was found to have a BRCA1 mutation.  The patient has two full brothers, a maternal half brother and sister and a paternal half brother and sister.  Her paternal half sister was diagnosed with cervical cancer at age 24 and died at 35.  Her maternal half sister reportedly tested negative for the BRCA1 hereditary mutation.  The patient's mother is deceased and her father is alive.  The patient's father is in his 57's and has never had cancer.  The patient is not in contact with him and therefore is not well informed of his family.  She reports that he has about 11 siblings, one uncle has an unknown form of cancer.  She is unaware of cousins with cancer.  Both  paternal grandparents are deceased.  The patient's mother had breast cancer at 49 and again at 4.  She died at 60.  The patient's mother reportedly had a BRCA1 mutation as well.  Her mother had three sisters.  One sister had breast cancer at 45 and again at 60, and another sister was diagnosed with stage IV breast cancer at 1 and died soon afterward.  The maternal grandparents are deceased from non cancer related issues, however the grandfather had several sisters with breast cancer.  Patient's maternal ancestors are of [place] American descent, and paternal ancestors are of [place] American and Native American descent. There is no reported Ashkenazi Jewish ancestry. There is no known consanguinity.  GENETIC TESTING:  At the time of Claudia Rivas's visit, we recommended she pursue genetic testing of the multi cancer panel, looking for the specific BRCA1 mutation that was identified in the family. The genetic testing reported out on December 18, 2017 through the Multi-Cancer Panel offered by [facility] identified a single, heterozygous pathogenic gene mutation called BRCA1, P.9509+3O>I (Splice donor).     MEDICAL MANAGEMENT: Women who have a BRCA mutation have an increased risk for both breast and ovarian cancer.   As discussed with Claudia Rivas, to reduce the risk for breast cancer, prophylactic bilateral mastectomy is the most  effective option for risk reduction. However, for women who choose to keep their breasts intensified screening is equally safe.  We recommend yearly mammograms, yearly breast MRI, twice-yearly clinical breast exams, and monthly self-breast exams. Claudia Rivas is referred for her mammograms by Dr. Nena Polio.  She will follow up with this provider in regards to her risk for breast cancer.  Claudia Rivas is interested in pursuing the high risk clinic.  We will check with Dr. Marin Olp to see if he is comfortable seeing the patient on an annual basis and referring  her for her screens.  Otherwise she will be seen in our high risk clinic at [facility] to discuss surgical options.  To reduce the risk for ovarian cancer, we recommend Claudia Rivas have a prophylactic bilateral salpingo-oophorectomy when childbearing is completed, if planned. We discussed that screening with CA-125 blood tests and transvaginal ultrasounds can be done twice per year. However, these tests have not been shown to detect ovarian cancer at an early stage.  Claudia Rivas will follow up with Dr. Benjamine Mola in regards to her ovarian cancer risk.    RISK REDUCTION: There are several things that can be offered to individuals who are carriers for BRCA mutations that will reduce the risk for getting cancer.   The use of oral contraceptives can lower the risk for ovarian cancer, and, per case control studies, does not significantly increase the risk for breast cancer in BRCA patients. Case control studies have shown that oral contraceptives can lower the risk for ovarian cancer in women with BRCA mutations. Additionally, a more recent meta-analysis, including one cohort (n=3,181) and one case control study (1,096 cases and 2,878 controls) also showed an inverse correlation between ovarian cancer and ever having used oral contraceptives (OR, 0.58; 95% CI = 0.46-0.73). Studies on oral contraceptives and breast cancer have been conflicting, with some studies suggesting that there is not an increased risk for breast cancer in BRCA mutation carriers, while others suggest that there could be a risk. That said, two meta-analysis studies have shown that there is not an increased risk for breast cancer with oral contraceptive use in BRCA1 and BRCA2 carriers.   In individuals who have a prophylactic bilateral salpingo-oophorectomy (BSO), the risk for breast cancer is reduced by up to 50%. It has been reported that short term hormone replacement therapy in women undergoing prophylactic BSO does not negate  the reduction of breast cancer risk associated with surgery (1.2018 NCCN guidelines).  FAMILY MEMBERS: It is important that all of Claudia Rivas's relatives (both men and women) know of the presence of this gene mutation. Site-specific genetic testing can sort out who in the family is at risk and who is not.   Ms. Lundahl children and siblings have a 50% chance to have inherited this mutation. We recommend they have genetic testing for this same mutation, as identifying the presence of this mutation would allow them to also take advantage of risk-reducing measures.   We discussed that all relatives are eligible for free single gene testing through [facility] for 90 days after the report date of Claudia Rivas's test.  Therefore her siblings, cousins, nieces/nephews, and children could all undergo complementary testing.  I provided a copy of the test result and released an online copy to the patient so that she can more easily send a copy to her relatives.  SUPPORT AND RESOURCES: If Claudia Rivas is interested in BRCA-specific information and support, there are two groups, Facing Our Risk (www.facingourrisk.com) and  Bright Pink (www.brightpink.org) which some people have found useful. They provide opportunities to speak with other individuals from high-risk families. To locate genetic counselors in other cities, visit the website of the [employer] (ArtistMovie.se) and Secretary/administrator for a Social worker by zip code.  We encouraged Claudia Rivas to remain in contact with [place] on an annual basis so we can update her personal and family histories, and let her know of advances in cancer genetics that may benefit the family. Our contact number was provided. Ms. Gatti questions were answered to her satisfaction today, and she knows she is welcome to call anytime with additional questions.   Karen P. Florene Glen, [place], [facility] Certified Genetic  Counselor [email or web address] phone: [phone]

## 2018-01-01 ENCOUNTER — Other Ambulatory Visit: Payer: Self-pay

## 2018-01-01 ENCOUNTER — Inpatient Hospital Stay (HOSPITAL_BASED_OUTPATIENT_CLINIC_OR_DEPARTMENT_OTHER): Payer: Managed Care, Other (non HMO) | Admitting: Hematology & Oncology

## 2018-01-01 ENCOUNTER — Encounter: Payer: Self-pay | Admitting: Hematology & Oncology

## 2018-01-01 ENCOUNTER — Telehealth: Payer: Self-pay | Admitting: Obstetrics & Gynecology

## 2018-01-01 VITALS — BP 132/79 | HR 97 | Temp 98.6°F | Resp 16 | Wt 162.0 lb

## 2018-01-01 DIAGNOSIS — Z803 Family history of malignant neoplasm of breast: Secondary | ICD-10-CM

## 2018-01-01 DIAGNOSIS — R599 Enlarged lymph nodes, unspecified: Secondary | ICD-10-CM

## 2018-01-01 DIAGNOSIS — Z1501 Genetic susceptibility to malignant neoplasm of breast: Secondary | ICD-10-CM | POA: Diagnosis not present

## 2018-01-01 DIAGNOSIS — I2609 Other pulmonary embolism with acute cor pulmonale: Secondary | ICD-10-CM | POA: Diagnosis not present

## 2018-01-01 DIAGNOSIS — D500 Iron deficiency anemia secondary to blood loss (chronic): Secondary | ICD-10-CM

## 2018-01-01 DIAGNOSIS — Z7901 Long term (current) use of anticoagulants: Secondary | ICD-10-CM | POA: Diagnosis not present

## 2018-01-01 DIAGNOSIS — I2782 Chronic pulmonary embolism: Secondary | ICD-10-CM

## 2018-01-01 DIAGNOSIS — I2601 Septic pulmonary embolism with acute cor pulmonale: Secondary | ICD-10-CM

## 2018-01-01 DIAGNOSIS — D509 Iron deficiency anemia, unspecified: Secondary | ICD-10-CM | POA: Diagnosis not present

## 2018-01-01 DIAGNOSIS — D573 Sickle-cell trait: Secondary | ICD-10-CM | POA: Diagnosis not present

## 2018-01-01 DIAGNOSIS — Z1509 Genetic susceptibility to other malignant neoplasm: Principal | ICD-10-CM

## 2018-01-01 NOTE — Telephone Encounter (Signed)
Called and left a message for patient to call back to schedule a new patient doctor referral appointment with our office to see Dr. Sabra Heck for consultation re: BSO.  Referral from Dr. Marin Olp by telephone to Dr. Sabra Heck today. See what specific dates work best for the patient.

## 2018-01-01 NOTE — Progress Notes (Signed)
Hematology and Oncology Follow Up Visit  Claudia Rivas [id number] 01-28-1971 47 y.o. 01/01/2018   Principle Diagnosis:  Bilateral pulmonary emboli Chronic subpectoral lymphadenopathy Sickle Cell trait Iron deficiency anemia  BRCA1 (+)  Current Therapy:   Xarelto 10 mg by mouth daily maintenance therapy - will complete September 1497  Folic Acid 1 mg po q day IV iron as indicated - last received in November 2018 x 2   Interim History:  Claudia Rivas is here today for follow-up.  The reason that she is actually in today is that she has tested positive for the BRCA1 gene.  There is a strong family history of breast cancer.  Her young daughter is up in [place] getting treated for breast cancer.  Her daughter is BRCA1 positive.  Claudia Rivas has had no problems with breast cancer.  She had a mammogram back in November and all this looked fine.  She did have a biopsy of a lymph node in January of last year, the biopsy ([id number]) showed a benign lymph node.  On genetic testing, she has the BRCA1 [id number] variant.  She is 47 years old.  She does not wish to have any further children.  I spoke with Claudia Rivas to do a bilateral TAH/BSO.  She has not had a history of breast cancer.  We talked about the possibility of a bilateral mastectomy.  I think her risk of breast cancer is over 70%.  She is going to think about a bilateral mastectomy.  1 of the issues is that she has to go to [place] to help with her daughter.  Her daughter is going to have surgery for her breast cancer I think in a couple weeks.  Otherwise, she is doing quite well.  Claudia Rivas is on low-dose Xarelto.  She will complete 1 year in September 2019.  Overall, her performance status is ECOG 1.   Medications:  Allergies as of 01/01/2018      Reactions   Latex Rash      Medication List        Accurate as of 01/01/18  1:25 PM. Always use your most recent med list.          acetaminophen  325 MG tablet Commonly known as:  TYLENOL Take 650 mg by mouth every 6 (six) hours as needed for mild pain.   cholecalciferol 1000 units tablet Commonly known as:  VITAMIN D Take 1,000 Units by mouth daily.   folic acid 1 MG tablet Commonly known as:  FOLVITE Take 1 tablet (1 mg total) by mouth daily.   NORLYDA 0.35 MG tablet Generic drug:  norethindrone TAKE 1 TABLET BY MOUTH DAILY   rivaroxaban 10 MG Tabs tablet Commonly known as:  XARELTO Take 1 tablet (10 mg total) by mouth daily with supper.       Allergies:  Allergies  Allergen Reactions  . Latex Rash    Past Medical History, Surgical history, Social history, and Family History were reviewed and updated.  Review of Systems: Review of Systems  Constitutional: Negative.   HENT: Negative.   Eyes: Negative.   Respiratory: Negative.   Cardiovascular: Negative.   Gastrointestinal: Negative.   Genitourinary: Negative.   Musculoskeletal: Negative.   Skin: Negative.   Neurological: Negative.   Endo/Heme/Allergies: Negative.   Psychiatric/Behavioral: Negative.      Physical Exam:  weight is 162 lb (73.5 kg). Her oral temperature is 98.6 F (37 C). Her blood pressure is 132/79 and her pulse is 97.  Her respiration is 16 and oxygen saturation is 99%.   Wt Readings from Last 3 Encounters:  01/01/18 162 lb (73.5 kg)  12/18/17 158 lb (71.7 kg)  10/11/17 153 lb (69.4 kg)    Physical Exam  Constitutional: She is oriented to person, place, and time.  Breast exam bilaterally shows no masses.  There is no nipple discharge.  There is no adenopathy in the axilla bilaterally.  HENT:  Head: Normocephalic and atraumatic.  Mouth/Throat: Oropharynx is clear and moist.  Eyes: EOM are normal. Pupils are equal, round, and reactive to light.  Neck: Normal range of motion.  Cardiovascular: Normal rate, regular rhythm and normal heart sounds.  Pulmonary/Chest: Effort normal and breath sounds normal.  Abdominal: Soft. Bowel  sounds are normal.  Musculoskeletal: Normal range of motion. She exhibits no edema, tenderness or deformity.  Lymphadenopathy:    She has no cervical adenopathy.  Neurological: She is alert and oriented to person, place, and time.  Skin: Skin is warm and dry. No rash noted. No erythema.  Psychiatric: She has a normal mood and affect. Her behavior is normal. Judgment and thought content normal.  Vitals reviewed.   Lab Results  Component Value Date   WBC 5.1 12/11/2017   HGB 13.4 12/11/2017   HCT 39.5 12/11/2017   MCV 81.3 12/11/2017   PLT 221 12/11/2017   Lab Results  Component Value Date   FERRITIN 351 (H) 12/11/2017   IRON 109 12/11/2017   TIBC 279 12/11/2017   UIBC 169 12/11/2017   IRONPCTSAT 39 12/11/2017   Lab Results  Component Value Date   RETICCTPCT 1.1 08/17/2016   RBC 4.86 12/11/2017   No results found for: KPAFRELGTCHN, LAMBDASER, KAPLAMBRATIO No results found for: IGGSERUM, IGA, IGMSERUM No results found for: Claudia Rivas, SPEI   Chemistry      Component Value Date/Time   NA 143 12/11/2017 1011   K 4.0 12/11/2017 1011   CL 111 (H) 10/11/2017 0847   CO2 25 12/11/2017 1011   BUN 11.2 12/11/2017 1011   CREATININE 0.9 12/11/2017 1011      Component Value Date/Time   CALCIUM 9.4 12/11/2017 1011   ALKPHOS 70 12/11/2017 1011   AST 15 12/11/2017 1011   ALT 14 12/11/2017 1011   BILITOT 0.43 12/11/2017 1011      Impression and Plan:   Claudia Rivas is a very pleasant 47 yo African American female with history of PE, sickle cell trait and iron deficiency anemia. She continues to do well on maintenance Xarelto and folic acid, taken daily as prescribed.   The main problem right now is this BRCA1 gene.  We have to be very proactive with respect to surveillance.  Since she had a mammogram in November, I will set her up with an MRI in May.  The standard surveillance recommendations now are to alternate  mammograms and MRIs every 6 months.  Having her ovaries removed will definitely be a huge benefit for her.  She may consider prophylactic mastectomy if she knows that she can have reconstruction afterwards.  I spent about 45 minutes with her.  Given that she is African-American, this makes it quite unusual that she is BRCA1 positive.  There is a very strong family history that we just cannot discount.  I will plan to see her back after her MRI is done in May.  I answered all of her questions.  She has a very good understanding of what the situation  is with her BRCA1 gene.  Volanda Napoleon, MD 1/21/20191:25 PM

## 2018-01-02 NOTE — Telephone Encounter (Signed)
Called and left a message for patient to call back to schedule a new patient doctor referral appointment with our office to see Dr. Sabra Heck for consultation.

## 2018-01-03 NOTE — Telephone Encounter (Signed)
Patient left message on answering machine returning call. °

## 2018-01-03 NOTE — Telephone Encounter (Signed)
Called and left a message for patient to call back to schedule a new patient doctor referral appointment with our officeto see Dr. Sabra Heck for consultation.

## 2018-01-05 ENCOUNTER — Other Ambulatory Visit: Payer: Self-pay | Admitting: Family

## 2018-01-05 DIAGNOSIS — Z1231 Encounter for screening mammogram for malignant neoplasm of breast: Secondary | ICD-10-CM

## 2018-01-24 ENCOUNTER — Ambulatory Visit: Payer: Managed Care, Other (non HMO) | Admitting: Obstetrics & Gynecology

## 2018-01-24 ENCOUNTER — Encounter: Payer: Self-pay | Admitting: Obstetrics & Gynecology

## 2018-01-24 ENCOUNTER — Other Ambulatory Visit: Payer: Self-pay

## 2018-01-24 VITALS — BP 132/98 | HR 88 | Resp 16 | Ht 61.0 in | Wt 160.0 lb

## 2018-01-24 DIAGNOSIS — Z1501 Genetic susceptibility to malignant neoplasm of breast: Secondary | ICD-10-CM

## 2018-01-24 DIAGNOSIS — Z86711 Personal history of pulmonary embolism: Secondary | ICD-10-CM

## 2018-01-24 DIAGNOSIS — Z1509 Genetic susceptibility to other malignant neoplasm: Secondary | ICD-10-CM

## 2018-01-24 DIAGNOSIS — N913 Primary oligomenorrhea: Secondary | ICD-10-CM | POA: Diagnosis not present

## 2018-01-24 DIAGNOSIS — Z124 Encounter for screening for malignant neoplasm of cervix: Secondary | ICD-10-CM | POA: Diagnosis not present

## 2018-01-24 NOTE — Progress Notes (Signed)
47 y.o. [id number] MarriedAfrican AmericanF here for a consult. Patient was referred by Dr. Marin Olp to discuss bilaterial oophorectomy. Patient has positive BRCA-1 testing 12/2017.  This testing was done after her 53 year old daughter was diagnosed with breast cance--triple negative.  Daughter lives in [place] and dad chemo-reuctive therapy and then lumpectomy.  Waiting on final pathology to see whether daughter needs additional surgery or just to finish up with radiation.  Pt has seen Dr. Marin Olp for consultation.  BSO with possible hysterectomy discussed.  Pt really just wants BSO as desires shorter recovery.    Patient reports her cycles have been changing over the last year.  She has skipped several months of cycles.  She is experiencing a lot of hot flashes and night sweats.  She does have some mild insomnia.  She is not really sure where she is from  mental standpoint.  Does need Pap smear updated as well today.  Patient's last menstrual period was 10/25/2017.          Sexually active: Yes.    The current method of family planning is oral progesterone-only contraceptive.    Exercising: No.  The patient does not participate in regular exercise at present. Smoker:  No  Health Maintenance: Pap:  2014  History of abnormal Pap:  Yes- repeat PAP -WNL  MMG:  10-31-17 WNL Colonoscopy: 2014 polyps  BMD:   Never TDaP:  10-26-09 Pneumonia vaccine(s):  08-12-09 Shingrix:   Never Hep C testing: 2010 NEG per patient  Screening Labs: PCP    reports that  has never smoked. she has never used smokeless tobacco. She reports that she drinks alcohol. She reports that she does not use drugs.  Past Medical History:  Diagnosis Date  . Diverticulitis   . Family history of BRCA1 gene positive   . Family history of breast cancer   . GERD (gastroesophageal reflux disease)   . Headache    "monthly" (08/17/2016)  . Iron deficiency anemia    received iron transfusion 08/17/2016  . Pneumonia 08/2009  . Pulmonary  embolism ([facility]) 08/16/2016  . Sickle cell trait [facility])     Past Surgical History:  Procedure Laterality Date  . BREAST BIOPSY Right     Current Outpatient Medications  Medication Sig Dispense Refill  . acetaminophen (TYLENOL) 325 MG tablet Take 650 mg by mouth every 6 (six) hours as needed for mild pain.    . cholecalciferol (VITAMIN D) 1000 units tablet Take 1,000 Units by mouth daily.    . folic acid (FOLVITE) 1 MG tablet Take 1 tablet (1 mg total) by mouth daily. 90 tablet 3  . norethindrone (NORLYDA) 0.35 MG tablet TAKE 1 TABLET BY MOUTH DAILY    . rivaroxaban (XARELTO) 10 MG TABS tablet Take 1 tablet (10 mg total) by mouth daily with supper. 90 tablet 3   No current facility-administered medications for this visit.     Family History  Problem Relation Age of Onset  . Crohn's disease Mother   . Breast cancer Mother 58       recurrance at 60, died at 81  . BRCA 1/2 Mother   . Crohn's disease Sister   . Diverticulitis Brother   . Breast cancer Daughter 16  . BRCA 1/2 Daughter        BRCA1 [id number] (Splice donor)  . Breast cancer Maternal Aunt 65  . BRCA 1/2 Maternal Aunt   . Breast cancer Maternal Aunt 57       dx stage  IV, lived 3 more weeks  . Heart attack Maternal Grandmother   . Cervical cancer Sister 36       pat 1/2 sister, died at 35    ROS:  Pertinent items are noted in HPI.  Otherwise, a comprehensive ROS was negative.  Exam:   BP (!) 132/98 (BP Location: Right Arm, Patient Position: Sitting, Cuff Size: Normal)   Pulse 88   Resp 16   Ht 5' 1" (1.549 m)   Wt 160 lb (72.6 kg)   LMP 10/25/2017   BMI 30.23 kg/m    Height: 5' 1" (154.9 cm)  Ht Readings from Last 3 Encounters:  01/24/18 5' 1" (1.549 m)  01/05/17 5' 1" (1.549 m)  09/18/16 5' 1" (1.549 m)    General appearance: alert, cooperative and appears stated age Head: Normocephalic, without obvious abnormality, atraumatic Neck: no adenopathy, supple, symmetrical, trachea midline and thyroid normal  to inspection and palpation Lungs: clear to auscultation bilaterally Breasts: normal appearance, no masses or tenderness Heart: regular rate and rhythm Abdomen: soft, non-tender; bowel sounds normal; no masses,  no organomegaly Extremities: extremities normal, atraumatic, no cyanosis or edema Skin: Skin color, texture, turgor normal. No rashes or lesions Lymph nodes: Cervical, supraclavicular, and axillary nodes normal. No abnormal inguinal nodes palpated Neurologic: Grossly normal   Pelvic: External genitalia:  no lesions              Urethra:  normal appearing urethra with no masses, tenderness or lesions              Bartholins and Skenes: normal                 Vagina: normal appearing vagina with normal color and discharge, no lesions              Cervix: no lesions              Pap taken: Yes.   Bimanual Exam:  Uterus:  normal size, contour, position, consistency, mobility, non-tender              Adnexa: normal adnexa and no mass, fullness, tenderness               Rectovaginal: Confirms               Anus:  normal sphincter tone, no lesions  Chaperone was present for exam.  Discussion: Laparoscopic bilateral salpingo-oophorectomy was discussed.  The hospital stay, incision sites and risk discussed.  Specifically, bowel, bladder, ureteral injury risks discussed.  Bleeding and transfusion risk discussed.  Risk for abnormal pathology and additional surgical treatment discussed.  Patient is aware if she cannot proceed over the next several weeks due to her daughter's needs, then I would recommend an ultrasound and Ca125 until she can proceed.  All questions answered.  A:  BRCA 1 positive Perimenopausal symptoms H/o PE thought due to OCP use, hematologic evlauation negative.  On Xarelto. HO sickle cell train H/O iron deficiency anemia Family hx of triple negative breast cancer in daughter  P:   Mammogram guidelines reviewed as well a screening MRI if she does not decide to have  prophylactic mastectomy.  She has not decided about this at this time. Will proceed with surgical planning for laparoscopic BSO pap smear and HR HPV obtained today. [place] will be obtained as well today to help pt know where she is in menopausal transition. return annually or prn  40 minutes spent with patient >50% of time was in  face to face discussion of above.

## 2018-01-25 LAB — FOLLICLE STIMULATING HORMONE: FSH: 37.7 m[IU]/mL

## 2018-01-26 ENCOUNTER — Encounter: Payer: Self-pay | Admitting: Obstetrics & Gynecology

## 2018-01-26 ENCOUNTER — Other Ambulatory Visit (HOSPITAL_COMMUNITY)
Admission: RE | Admit: 2018-01-26 | Discharge: 2018-01-26 | Disposition: A | Discharge status: Home / Self Care | Payer: Managed Care, Other (non HMO) | Source: Ambulatory Visit | Attending: Obstetrics & Gynecology | Admitting: Obstetrics & Gynecology

## 2018-01-26 DIAGNOSIS — Z124 Encounter for screening for malignant neoplasm of cervix: Secondary | ICD-10-CM | POA: Diagnosis present

## 2018-01-30 ENCOUNTER — Telehealth: Payer: Self-pay | Admitting: Obstetrics & Gynecology

## 2018-01-30 LAB — CYTOLOGY - PAP
Adequacy: ABSENT
Diagnosis: NEGATIVE
HPV: NOT DETECTED

## 2018-01-30 NOTE — Telephone Encounter (Signed)
Spoke with patient regarding benefit for recommended surgery. Patient understood and agreeable.  Patient aware this is professional benefit only. Patient aware will be contacted by hospital for separate benefits. Patient advises her daughter has breast cancer and states she will be traveling to [place] to assist her daughter during her treatment. Patient states she will be looking to schedule in April or May, but will ball back when she is ready to proceed with scheduling   cc: Lamont Snowball

## 2018-01-31 NOTE — Telephone Encounter (Signed)
Call to patient. Support given regarding daughter diagnosis. Daughter is 49 years old.  Advised we will wait to hear back from her regarding surgical scheduling. Advised needs to allow 4-6 weeks for scheduling particularly as summer approaches.  Encouraged to call as needed and advised Dr Sabra Heck will review call.  Routing to provider for final review. Patient agreeable to disposition. Will close encounter.

## 2018-02-01 NOTE — Telephone Encounter (Signed)
Forwarding to Dr Sabra Heck for review of 01/30/18 phone encounter

## 2018-02-08 ENCOUNTER — Other Ambulatory Visit: Payer: Self-pay | Admitting: Hematology & Oncology

## 2018-03-12 ENCOUNTER — Other Ambulatory Visit: Payer: Managed Care, Other (non HMO)

## 2018-03-12 ENCOUNTER — Ambulatory Visit: Payer: Managed Care, Other (non HMO) | Admitting: Family

## 2018-03-13 ENCOUNTER — Ambulatory Visit: Payer: Managed Care, Other (non HMO) | Admitting: Family

## 2018-03-13 ENCOUNTER — Other Ambulatory Visit: Payer: Managed Care, Other (non HMO)

## 2018-03-14 ENCOUNTER — Inpatient Hospital Stay: Payer: Managed Care, Other (non HMO) | Attending: Family

## 2018-03-14 ENCOUNTER — Other Ambulatory Visit: Payer: Self-pay

## 2018-03-14 ENCOUNTER — Encounter: Payer: Self-pay | Admitting: Family

## 2018-03-14 ENCOUNTER — Inpatient Hospital Stay (HOSPITAL_BASED_OUTPATIENT_CLINIC_OR_DEPARTMENT_OTHER): Payer: Managed Care, Other (non HMO) | Admitting: Family

## 2018-03-14 VITALS — BP 136/87 | HR 89 | Temp 98.6°F | Wt 163.0 lb

## 2018-03-14 DIAGNOSIS — D573 Sickle-cell trait: Secondary | ICD-10-CM | POA: Insufficient documentation

## 2018-03-14 DIAGNOSIS — Z7901 Long term (current) use of anticoagulants: Secondary | ICD-10-CM

## 2018-03-14 DIAGNOSIS — Z79899 Other long term (current) drug therapy: Secondary | ICD-10-CM | POA: Diagnosis not present

## 2018-03-14 DIAGNOSIS — Z1509 Genetic susceptibility to other malignant neoplasm: Secondary | ICD-10-CM

## 2018-03-14 DIAGNOSIS — D500 Iron deficiency anemia secondary to blood loss (chronic): Secondary | ICD-10-CM | POA: Diagnosis not present

## 2018-03-14 DIAGNOSIS — Z1501 Genetic susceptibility to malignant neoplasm of breast: Secondary | ICD-10-CM

## 2018-03-14 DIAGNOSIS — I2601 Septic pulmonary embolism with acute cor pulmonale: Secondary | ICD-10-CM

## 2018-03-14 DIAGNOSIS — I2609 Other pulmonary embolism with acute cor pulmonale: Secondary | ICD-10-CM

## 2018-03-14 DIAGNOSIS — Z86711 Personal history of pulmonary embolism: Secondary | ICD-10-CM

## 2018-03-14 DIAGNOSIS — I2782 Chronic pulmonary embolism: Secondary | ICD-10-CM

## 2018-03-14 LAB — CBC WITH DIFFERENTIAL (CANCER CENTER ONLY)
BASOS ABS: 0 10*3/uL (ref 0.0–0.1)
BASOS PCT: 0 %
EOS PCT: 1 %
Eosinophils Absolute: 0 10*3/uL (ref 0.0–0.5)
HCT: 37.3 % (ref 34.8–46.6)
Hemoglobin: 13.2 g/dL (ref 11.6–15.9)
Lymphocytes Relative: 26 %
Lymphs Abs: 1.5 10*3/uL (ref 0.9–3.3)
MCH: 30.4 pg (ref 26.0–34.0)
MCHC: 35.4 g/dL (ref 32.0–36.0)
MCV: 85.9 fL (ref 81.0–101.0)
MONO ABS: 0.4 10*3/uL (ref 0.1–0.9)
Monocytes Relative: 7 %
NEUTROS ABS: 3.9 10*3/uL (ref 1.5–6.5)
Neutrophils Relative %: 66 %
PLATELETS: 262 10*3/uL (ref 145–400)
RBC: 4.34 MIL/uL (ref 3.70–5.32)
RDW: 13.3 % (ref 11.1–15.7)
WBC: 5.9 10*3/uL (ref 3.9–10.0)

## 2018-03-14 LAB — CMP (CANCER CENTER ONLY)
ALBUMIN: 3.6 g/dL (ref 3.5–5.0)
ALT: 20 U/L (ref 10–47)
AST: 23 U/L (ref 11–38)
Alkaline Phosphatase: 66 U/L (ref 26–84)
Anion gap: 4 — ABNORMAL LOW (ref 5–15)
BUN: 8 mg/dL (ref 7–22)
CALCIUM: 8.3 mg/dL (ref 8.0–10.3)
CO2: 30 mmol/L (ref 18–33)
CREATININE: 0.9 mg/dL (ref 0.60–1.20)
Chloride: 107 mmol/L (ref 98–108)
GLUCOSE: 92 mg/dL (ref 73–118)
Potassium: 3.3 mmol/L (ref 3.3–4.7)
Sodium: 141 mmol/L (ref 128–145)
Total Bilirubin: 0.5 mg/dL (ref 0.2–1.6)
Total Protein: 7.2 g/dL (ref 6.4–8.1)

## 2018-03-14 NOTE — Progress Notes (Signed)
Hematology and Oncology Follow Up Visit  Claudia Rivas [id number] November 14, 1971 47 y.o. 03/14/2018   Principle Diagnosis:  Bilateral pulmonary emboli Chronic subpectoral lymphadenopathy Sickle Cell trait Iron deficiency anemia  BRCA1 (+)  Current Therapy:   Xarelto 10 mg by mouth daily maintenance therapy -willcomplete September 0240  Folic Acid 1 mg po q day IV iron as indicated - last received in November 2018 x 2   Interim History:  Claudia Rivas is here today for follow-up. She is getting over a cold and has sinus congestion with a cough and yellow sputum at times.  No fever, chills, n/v, rash, dizziness, SOB, chest pain, palpitations, abdominal pain or changes in bowel or bladder habits.   She is currently on her cycle and states that it is heavy. She verbalized that she is doing well on Xarelto 10 mg PO daily and taking as prescribed.  No swelling, tenderness, numbness or tingling in her extremities. No c/o pain.  No lymphadenopathy noted on exam.  She has maintained a good appetite and is staying well hydrated. Her weight is stable.  She continues to go back and forth to [place] to be with her daughter. Her surgery went well and her last chemo is soon.   ECOG Performance Status: 1 - Symptomatic but completely ambulatory  Medications:  Allergies as of 03/14/2018      Reactions   Latex Rash      Medication List        Accurate as of 03/14/18  3:32 PM. Always use your most recent med list.          acetaminophen 325 MG tablet Commonly known as:  TYLENOL Take 650 mg by mouth every 6 (six) hours as needed for mild pain.   cholecalciferol 1000 units tablet Commonly known as:  VITAMIN D Take 1,000 Units by mouth daily.   folic acid 1 MG tablet Commonly known as:  FOLVITE Take 1 tablet (1 mg total) by mouth daily.   NORLYDA 0.35 MG tablet Generic drug:  norethindrone TAKE 1 TABLET BY MOUTH DAILY   rivaroxaban 10 MG Tabs tablet Commonly known as:   XARELTO Take 1 tablet (10 mg total) by mouth daily with supper.   XARELTO 20 MG Tabs tablet Generic drug:  rivaroxaban TAKE 1 TABLET DAILY WITH SUPPER       Allergies:  Allergies  Allergen Reactions  . Latex Rash    Past Medical History, Surgical history, Social history, and Family History were reviewed and updated.  Review of Systems: All other 10 point review of systems is negative.   Physical Exam:  vitals were not taken for this visit.   Wt Readings from Last 3 Encounters:  01/24/18 160 lb (72.6 kg)  01/01/18 162 lb (73.5 kg)  12/18/17 158 lb (71.7 kg)    Ocular: Sclerae unicteric, pupils equal, round and reactive to light Ear-nose-throat: Oropharynx clear, dentition fair Lymphatic: No cervical, supraclavicular or axillary adenopathy Lungs no rales or rhonchi, good excursion bilaterally Heart regular rate and rhythm, no murmur appreciated Abd soft, nontender, positive bowel sounds, no liver or spleen tip palpated on exam, no fluid wave  MSK no focal spinal tenderness, no joint edema Neuro: non-focal, well-oriented, appropriate affect Breasts:   Lab Results  Component Value Date   WBC 5.1 12/11/2017   HGB 13.4 12/11/2017   HCT 39.5 12/11/2017   MCV 81.3 12/11/2017   PLT 221 12/11/2017   Lab Results  Component Value Date   FERRITIN 351 (H) 12/11/2017  IRON 109 12/11/2017   TIBC 279 12/11/2017   UIBC 169 12/11/2017   IRONPCTSAT 39 12/11/2017   Lab Results  Component Value Date   RETICCTPCT 1.1 08/17/2016   RBC 4.86 12/11/2017   No results found for: KPAFRELGTCHN, LAMBDASER, KAPLAMBRATIO No results found for: IGGSERUM, IGA, IGMSERUM No results found for: Odetta Pink, SPEI   Chemistry      Component Value Date/Time   NA 143 12/11/2017 1011   K 4.0 12/11/2017 1011   CL 111 (H) 10/11/2017 0847   CO2 25 12/11/2017 1011   BUN 11.2 12/11/2017 1011   CREATININE 0.9 12/11/2017 1011      Component  Value Date/Time   CALCIUM 9.4 12/11/2017 1011   ALKPHOS 70 12/11/2017 1011   AST 15 12/11/2017 1011   ALT 14 12/11/2017 1011   BILITOT 0.43 12/11/2017 1011      Impression and Plan: Claudia Rivas is a pleasant 47 yo African American female with history of PE, sickle cell trait, iron deficiency anemia and BRCA1 (+). She is recuperating from a cold but doing well over all.  She is doing well on Xarelto 10 mg PO daily. No evidence of recurrent thrombus so far.  We will see what her iron studies show and bring her back in for infusion if needed.  She will continue to take her folic acid daily.  We will get a breast MRI in May. I called and left a message on her personal voicemail with instructions and call back number for any questions.  We will plan to see her back in another 3 months for follow-up.  She will contact our office with any questions or concerns. We can certainly see her sooner if need be.   Laverna Peace, NP 4/3/20193:32 PM

## 2018-03-15 LAB — IRON AND TIBC
IRON: 43 ug/dL (ref 41–142)
SATURATION RATIOS: 16 % — AB (ref 21–57)
TIBC: 265 ug/dL (ref 236–444)
UIBC: 222 ug/dL

## 2018-03-15 LAB — RETICULOCYTES
RBC.: 4.19 MIL/uL (ref 3.70–5.45)
RETIC CT PCT: 1 % (ref 0.7–2.1)
Retic Count, Absolute: 41.9 10*3/uL (ref 33.7–90.7)

## 2018-03-15 LAB — FERRITIN: Ferritin: 252 ng/mL (ref 9–269)

## 2018-03-19 ENCOUNTER — Inpatient Hospital Stay: Payer: Managed Care, Other (non HMO)

## 2018-03-21 ENCOUNTER — Other Ambulatory Visit: Payer: Self-pay

## 2018-03-21 ENCOUNTER — Inpatient Hospital Stay: Payer: Managed Care, Other (non HMO)

## 2018-03-21 VITALS — BP 127/63 | HR 78 | Temp 98.2°F | Resp 17

## 2018-03-21 DIAGNOSIS — D500 Iron deficiency anemia secondary to blood loss (chronic): Secondary | ICD-10-CM

## 2018-03-21 DIAGNOSIS — Z86711 Personal history of pulmonary embolism: Secondary | ICD-10-CM | POA: Diagnosis not present

## 2018-03-21 MED ORDER — SODIUM CHLORIDE 0.9 % IV SOLN
510.0000 mg | Freq: Once | INTRAVENOUS | Status: AC
  Administered 2018-03-21: 510 mg via INTRAVENOUS
  Filled 2018-03-21: qty 17

## 2018-03-21 NOTE — Patient Instructions (Signed)

## 2018-04-17 ENCOUNTER — Ambulatory Visit (HOSPITAL_COMMUNITY)
Admission: RE | Admit: 2018-04-17 | Discharge: 2018-04-17 | Disposition: A | Discharge status: Home / Self Care | Payer: Managed Care, Other (non HMO) | Source: Ambulatory Visit | Attending: Family | Admitting: Family

## 2018-04-17 ENCOUNTER — Ambulatory Visit (HOSPITAL_COMMUNITY): Admission: RE | Admit: 2018-04-17 | Payer: Managed Care, Other (non HMO) | Source: Ambulatory Visit

## 2018-04-17 DIAGNOSIS — Z1501 Genetic susceptibility to malignant neoplasm of breast: Secondary | ICD-10-CM | POA: Diagnosis present

## 2018-04-17 DIAGNOSIS — Z1509 Genetic susceptibility to other malignant neoplasm: Secondary | ICD-10-CM | POA: Diagnosis present

## 2018-04-17 MED ORDER — GADOBENATE DIMEGLUMINE 529 MG/ML IV SOLN
15.0000 mL | Freq: Once | INTRAVENOUS | Status: AC | PRN
  Administered 2018-04-17: 15 mL via INTRAVENOUS

## 2018-04-18 ENCOUNTER — Telehealth: Payer: Self-pay | Admitting: Family

## 2018-04-18 NOTE — Telephone Encounter (Signed)
I spoke with Claudia Rivas and went over her breast MRI results. We will repeat in 1 year along with mammogram. All questions were answered and she will contact us with any questions or concerns.

## 2018-06-20 ENCOUNTER — Other Ambulatory Visit: Payer: Self-pay

## 2018-06-20 ENCOUNTER — Inpatient Hospital Stay: Payer: Managed Care, Other (non HMO) | Attending: Family | Admitting: Hematology & Oncology

## 2018-06-20 ENCOUNTER — Encounter: Payer: Self-pay | Admitting: Hematology & Oncology

## 2018-06-20 ENCOUNTER — Inpatient Hospital Stay: Payer: Managed Care, Other (non HMO)

## 2018-06-20 VITALS — BP 129/81 | HR 77 | Temp 98.6°F | Resp 20 | Wt 162.8 lb

## 2018-06-20 DIAGNOSIS — Z86711 Personal history of pulmonary embolism: Secondary | ICD-10-CM | POA: Diagnosis not present

## 2018-06-20 DIAGNOSIS — D573 Sickle-cell trait: Secondary | ICD-10-CM

## 2018-06-20 DIAGNOSIS — D500 Iron deficiency anemia secondary to blood loss (chronic): Secondary | ICD-10-CM

## 2018-06-20 DIAGNOSIS — Z1501 Genetic susceptibility to malignant neoplasm of breast: Secondary | ICD-10-CM | POA: Insufficient documentation

## 2018-06-20 DIAGNOSIS — I2609 Other pulmonary embolism with acute cor pulmonale: Secondary | ICD-10-CM

## 2018-06-20 DIAGNOSIS — Z7901 Long term (current) use of anticoagulants: Secondary | ICD-10-CM | POA: Insufficient documentation

## 2018-06-20 DIAGNOSIS — R599 Enlarged lymph nodes, unspecified: Secondary | ICD-10-CM | POA: Insufficient documentation

## 2018-06-20 DIAGNOSIS — I2601 Septic pulmonary embolism with acute cor pulmonale: Secondary | ICD-10-CM | POA: Diagnosis not present

## 2018-06-20 DIAGNOSIS — Z79899 Other long term (current) drug therapy: Secondary | ICD-10-CM | POA: Diagnosis not present

## 2018-06-20 DIAGNOSIS — D509 Iron deficiency anemia, unspecified: Secondary | ICD-10-CM | POA: Insufficient documentation

## 2018-06-20 DIAGNOSIS — Z803 Family history of malignant neoplasm of breast: Secondary | ICD-10-CM | POA: Insufficient documentation

## 2018-06-20 DIAGNOSIS — I2782 Chronic pulmonary embolism: Secondary | ICD-10-CM

## 2018-06-20 DIAGNOSIS — Z1509 Genetic susceptibility to other malignant neoplasm: Principal | ICD-10-CM

## 2018-06-20 LAB — CMP (CANCER CENTER ONLY)
ALBUMIN: 3.5 g/dL (ref 3.5–5.0)
ALT: 22 U/L (ref 10–47)
AST: 22 U/L (ref 11–38)
Alkaline Phosphatase: 48 U/L (ref 26–84)
Anion gap: 12 (ref 5–15)
BUN: 8 mg/dL (ref 7–22)
CO2: 24 mmol/L (ref 18–33)
CREATININE: 0.7 mg/dL (ref 0.60–1.20)
Calcium: 8.6 mg/dL (ref 8.0–10.3)
Chloride: 108 mmol/L (ref 98–108)
GLUCOSE: 111 mg/dL (ref 73–118)
Potassium: 3.8 mmol/L (ref 3.3–4.7)
SODIUM: 144 mmol/L (ref 128–145)
Total Bilirubin: 0.5 mg/dL (ref 0.2–1.6)
Total Protein: 7.6 g/dL (ref 6.4–8.1)

## 2018-06-20 LAB — CBC WITH DIFFERENTIAL (CANCER CENTER ONLY)
BASOS ABS: 0 10*3/uL (ref 0.0–0.1)
BASOS PCT: 0 %
EOS PCT: 1 %
Eosinophils Absolute: 0 10*3/uL (ref 0.0–0.5)
HCT: 35.9 % (ref 34.8–46.6)
Hemoglobin: 12.4 g/dL (ref 11.6–15.9)
LYMPHS PCT: 28 %
Lymphs Abs: 1.4 10*3/uL (ref 0.9–3.3)
MCH: 30 pg (ref 26.0–34.0)
MCHC: 34.5 g/dL (ref 32.0–36.0)
MCV: 86.9 fL (ref 81.0–101.0)
Monocytes Absolute: 0.3 10*3/uL (ref 0.1–0.9)
Monocytes Relative: 7 %
Neutro Abs: 3.4 10*3/uL (ref 1.5–6.5)
Neutrophils Relative %: 64 %
PLATELETS: 326 10*3/uL (ref 145–400)
RBC: 4.13 MIL/uL (ref 3.70–5.32)
RDW: 14.2 % (ref 11.1–15.7)
WBC Count: 5.2 10*3/uL (ref 3.9–10.0)

## 2018-06-20 LAB — RETICULOCYTES
RBC.: 4.06 MIL/uL (ref 3.70–5.45)
RETIC COUNT ABSOLUTE: 85.3 10*3/uL (ref 33.7–90.7)
Retic Ct Pct: 2.1 % (ref 0.7–2.1)

## 2018-06-20 LAB — FERRITIN: Ferritin: 79 ng/mL (ref 11–307)

## 2018-06-20 LAB — IRON AND TIBC
IRON: 52 ug/dL (ref 41–142)
SATURATION RATIOS: 16 % — AB (ref 21–57)
TIBC: 331 ug/dL (ref 236–444)
UIBC: 279 ug/dL

## 2018-06-20 NOTE — Progress Notes (Signed)
Hematology and Oncology Follow Up Visit  Claudia Rivas [id number] [birth year] 47 y.o. 06/20/2018   Principle Diagnosis:  Bilateral pulmonary emboli Chronic subpectoral lymphadenopathy Sickle Cell trait Iron deficiency anemia  BRCA1 (+)  Current Therapy:   Xarelto 10 mg by mouth daily maintenance therapy -willcomplete September 8366  Folic Acid 1 mg po q day IV iron as indicated - last received in November 2018 x 2   Interim History:  Claudia Rivas is here today for follow-up.  She unfortunately lost her job.  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  I am very sad about this.  I know that she is a good Pharmacist, hospital.  She sees her gynecologist this Friday.  Apparently, she said that the gynecologist did not want to do surgery for her ovaries and hysterectomy because she is on Xarelto.  I told Claudia Rivas that it probably is safer for her to have a hysterectomy while she is on Xarelto.  It is absolutely no contraindication for surgery.  She would just stop Xarelto 2 days before her surgery and start the day after.  Of note, Claudia Rivas does have the BRCA gene.  Her daughter has breast cancer.  We are watching her iron studies.  In April, her ferritin was 252 with iron saturation of 16%.  She did get a dose of iron in April.  ECOG Performance Status: 1 - Symptomatic but completely ambulatory  Medications:  Allergies as of 06/20/2018      Reactions   Latex Rash      Medication List        Accurate as of 06/20/18  9:14 AM. Always use your most recent med list.          acetaminophen 325 MG tablet Commonly known as:  TYLENOL Take 650 mg by mouth every 6 (six) hours as needed for mild pain.   cholecalciferol 1000 units tablet Commonly known as:  VITAMIN D Take 1,000 Units by mouth daily.   folic acid 1 MG tablet Commonly known as:  FOLVITE Take 1 tablet (1 mg total) by mouth daily.   norethindrone 5 MG tablet Commonly known as:   AYGESTIN Take 5 mg by mouth daily.   NORLYDA 0.35 MG tablet Generic drug:  norethindrone TAKE 1 TABLET BY MOUTH DAILY   rivaroxaban 10 MG Tabs tablet Commonly known as:  XARELTO Take 1 tablet (10 mg total) by mouth daily with supper.   XARELTO 20 MG Tabs tablet Generic drug:  rivaroxaban TAKE 1 TABLET DAILY WITH SUPPER       Allergies:  Allergies  Allergen Reactions  . Latex Rash    Past Medical History, Surgical history, Social history, and Family History were reviewed and updated.  Review of Systems: Review of Systems  Constitutional: Negative.   HENT: Negative.   Eyes: Negative.   Respiratory: Negative.   Cardiovascular: Negative.   Gastrointestinal: Negative.   Genitourinary: Negative.   Musculoskeletal: Negative.   Skin: Negative.   Neurological: Negative.   Endo/Heme/Allergies: Negative.   Psychiatric/Behavioral: Negative.      Physical Exam:  weight is 162 lb 12.8 oz (73.8 kg). Her oral temperature is 98.6 F (37 C). Her blood pressure is 129/81 and her pulse is 77. Her respiration is 20 and oxygen saturation is 100%.   Wt Readings from Last 3 Encounters:  06/20/18 162 lb 12.8 oz (73.8 kg)  03/14/18 163 lb (73.9 kg)  01/24/18 160 lb (72.6 kg)  Physical Exam  Constitutional: She is oriented to person, place, and time.  HENT:  Head: Normocephalic and atraumatic.  Mouth/Throat: Oropharynx is clear and moist.  Eyes: Pupils are equal, round, and reactive to light. EOM are normal.  Neck: Normal range of motion.  Cardiovascular: Normal rate, regular rhythm and normal heart sounds.  Pulmonary/Chest: Effort normal and breath sounds normal.  Abdominal: Soft. Bowel sounds are normal.  Musculoskeletal: Normal range of motion. She exhibits no edema, tenderness or deformity.  Lymphadenopathy:    She has no cervical adenopathy.  Neurological: She is alert and oriented to person, place, and time.  Skin: Skin is warm and dry. No rash noted. No erythema.    Psychiatric: She has a normal mood and affect. Her behavior is normal. Judgment and thought content normal.  Vitals reviewed.    Lab Results  Component Value Date   WBC 5.2 06/20/2018   HGB 12.4 06/20/2018   HCT 35.9 06/20/2018   MCV 86.9 06/20/2018   PLT 326 06/20/2018   Lab Results  Component Value Date   FERRITIN 252 03/14/2018   IRON 43 03/14/2018   TIBC 265 03/14/2018   UIBC 222 03/14/2018   IRONPCTSAT 16 (L) 03/14/2018   Lab Results  Component Value Date   RETICCTPCT 1.0 03/14/2018   RBC 4.13 06/20/2018   No results found for: KPAFRELGTCHN, LAMBDASER, KAPLAMBRATIO No results found for: IGGSERUM, IGA, IGMSERUM No results found for: Odetta Pink, SPEI   Chemistry      Component Value Date/Time   NA 141 03/14/2018 1523   NA 143 12/11/2017 1011   K 3.3 03/14/2018 1523   K 4.0 12/11/2017 1011   CL 107 03/14/2018 1523   CL 111 (H) 10/11/2017 0847   CO2 30 03/14/2018 1523   CO2 25 12/11/2017 1011   BUN 8 03/14/2018 1523   BUN 11.2 12/11/2017 1011   CREATININE 0.90 03/14/2018 1523   CREATININE 0.9 12/11/2017 1011      Component Value Date/Time   CALCIUM 8.3 03/14/2018 1523   CALCIUM 9.4 12/11/2017 1011   ALKPHOS 66 03/14/2018 1523   ALKPHOS 70 12/11/2017 1011   AST 23 03/14/2018 1523   AST 15 12/11/2017 1011   ALT 20 03/14/2018 1523   ALT 14 12/11/2017 1011   BILITOT 0.5 03/14/2018 1523   BILITOT 0.43 12/11/2017 1011      Impression and Plan: Claudia Rivas is a pleasant 47 yo African American female with history of PE, sickle cell trait, iron deficiency anemia and BRCA1 (+).   She is doing well on the Xarelto.  She will finish the Xarelto in September.  We talked about the surgery.  Again, I see no problems with her having surgery with her on Xarelto.  I will plan to see her back in 2 months.  At that point time, we probably will stop the Xarelto and may be consider her for aspirin.    Claudia Napoleon, MD 7/10/20199:14 AM

## 2018-06-22 DIAGNOSIS — D219 Benign neoplasm of connective and other soft tissue, unspecified: Secondary | ICD-10-CM | POA: Insufficient documentation

## 2018-06-25 ENCOUNTER — Other Ambulatory Visit: Payer: Self-pay

## 2018-06-25 ENCOUNTER — Inpatient Hospital Stay: Payer: Managed Care, Other (non HMO)

## 2018-06-25 VITALS — BP 131/80 | HR 81 | Temp 98.8°F | Resp 18

## 2018-06-25 DIAGNOSIS — D500 Iron deficiency anemia secondary to blood loss (chronic): Secondary | ICD-10-CM

## 2018-06-25 DIAGNOSIS — Z86711 Personal history of pulmonary embolism: Secondary | ICD-10-CM | POA: Diagnosis not present

## 2018-06-25 MED ORDER — SODIUM CHLORIDE 0.9 % IV SOLN
510.0000 mg | Freq: Once | INTRAVENOUS | Status: AC
  Administered 2018-06-25: 510 mg via INTRAVENOUS
  Filled 2018-06-25: qty 17

## 2018-06-25 NOTE — Patient Instructions (Signed)

## 2018-07-13 IMAGING — CT CT ANGIO CHEST
2 of 8 series · 18 of 36 positions shown · IV contrast (isovue)
Comparison: 08/30/2012 chest radiograph

CLINICAL DATA: Right chest pain for 2 weeks intermittent. Shortness
of breath. Dyspnea. Elevated D-dimer level.

EXAM:
CT ANGIOGRAPHY CHEST WITH CONTRAST
TECHNIQUE: Multidetector CT imaging of the chest was performed using the
standard protocol during bolus administration of intravenous
contrast. Multiplanar CT image reconstructions and MIPs were
obtained to evaluate the vascular anatomy.
CONTRAST:  100 cc Isovue 370

[Series 6: pe thins · axial · 0.59mm/px · z∈[+1151,+1364]mm · 17 of 239 slices shown]
[im 13/239  lung]
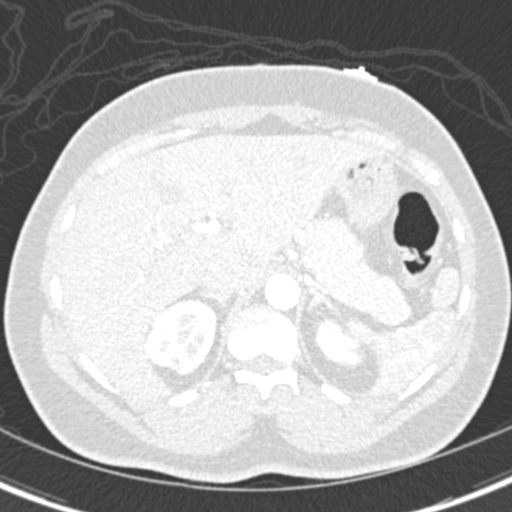
[im 26/239  mediastinal]
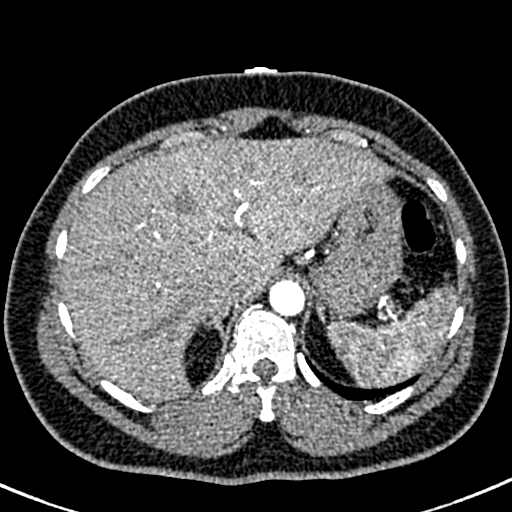
[im 38/239  lung]
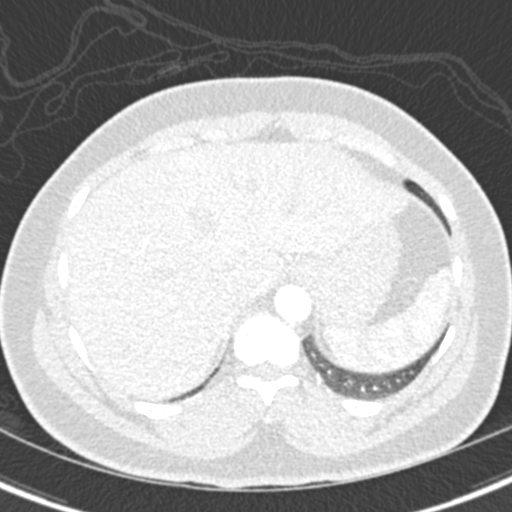
[im 51/239  mediastinal]
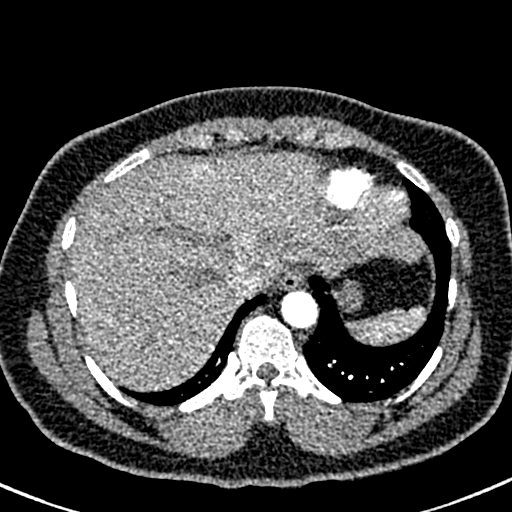
[im 63/239  lung]
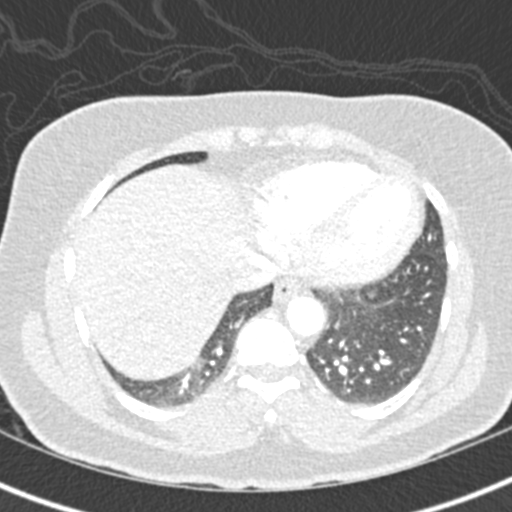
[im 76/239  mediastinal]
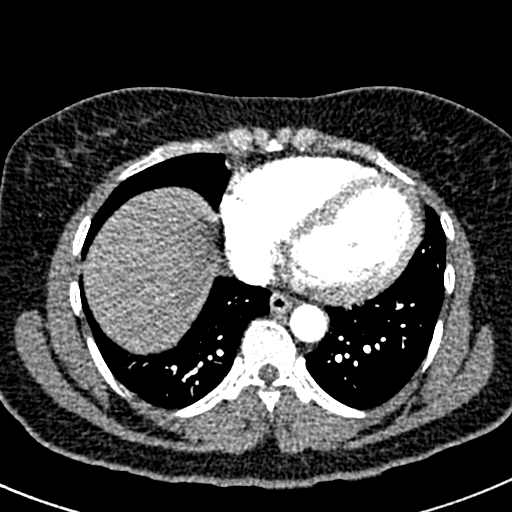
[im 88/239  lung]
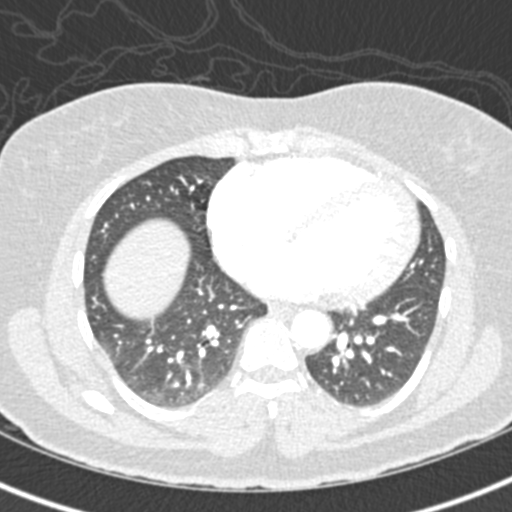
[im 101/239  mediastinal]
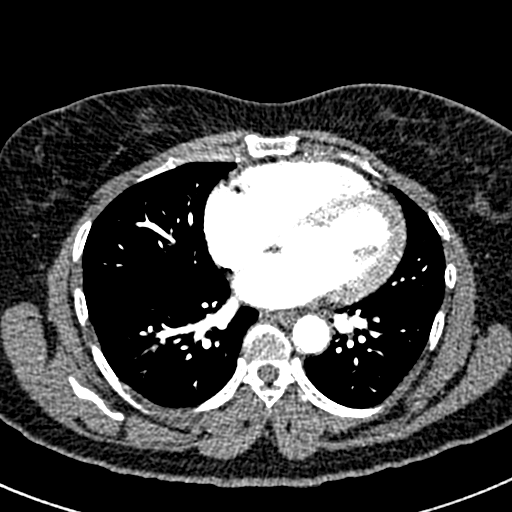
[im 126/239  lung]
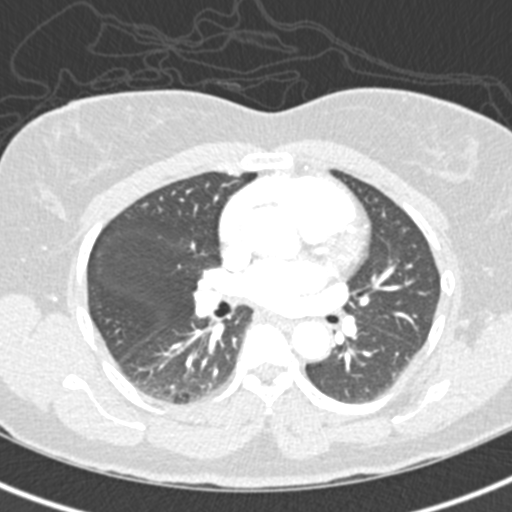
[im 138/239  mediastinal]
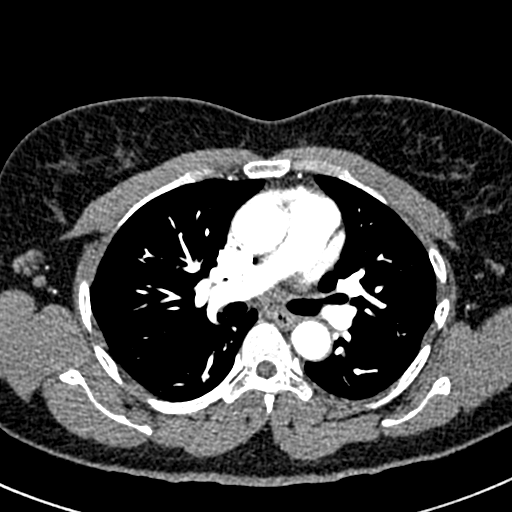
[im 151/239  lung]
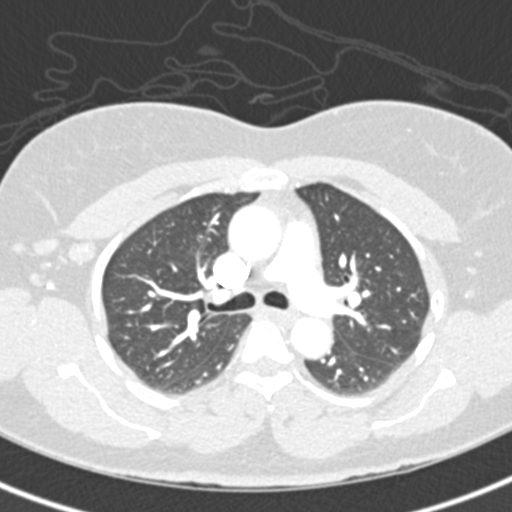
[im 163/239  mediastinal]
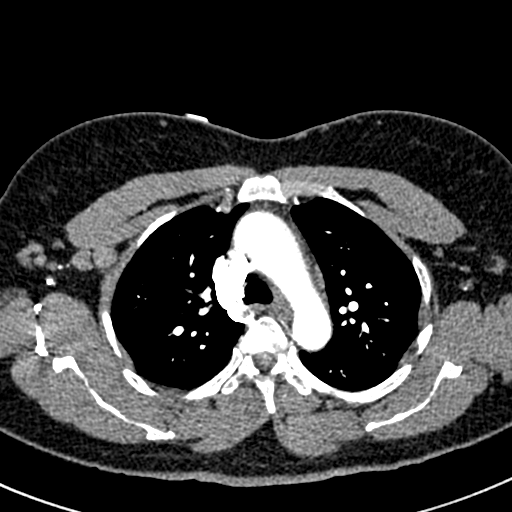
[im 176/239  lung]
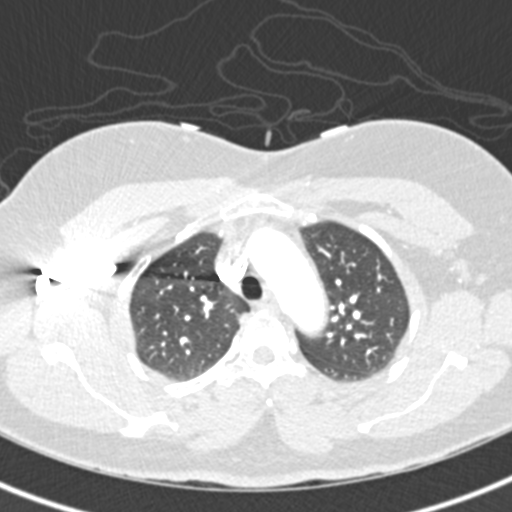
[im 188/239  mediastinal]
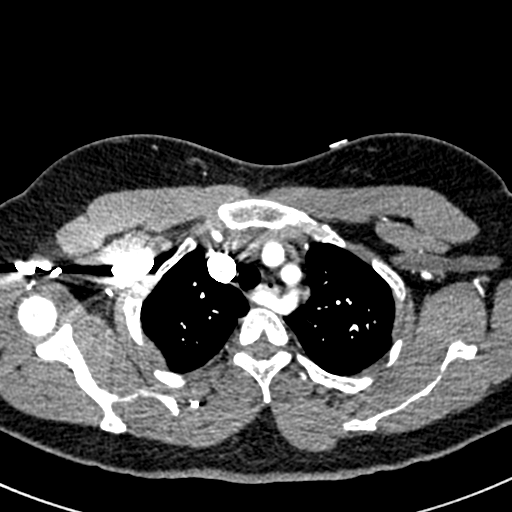
[im 201/239  lung]
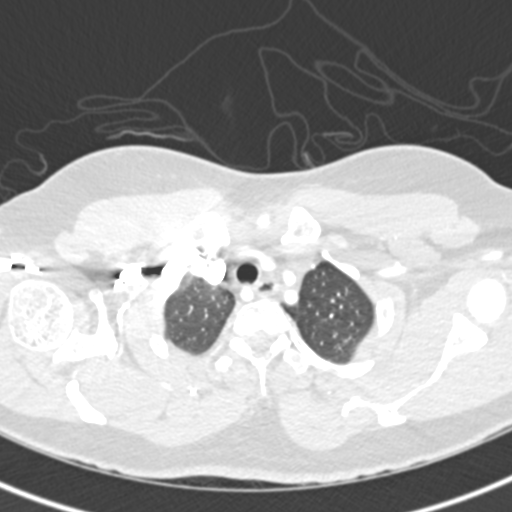
[im 213/239  mediastinal]
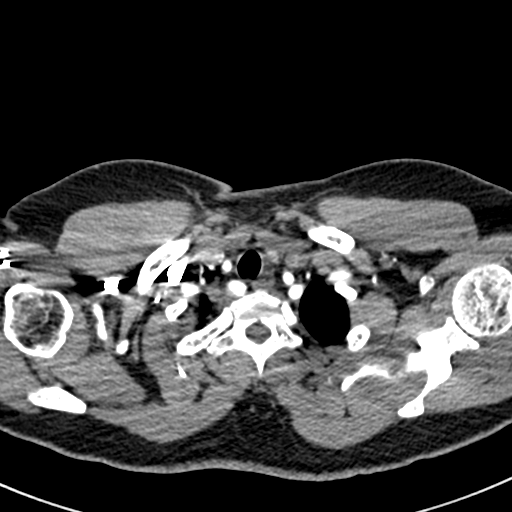
[im 226/239  lung]
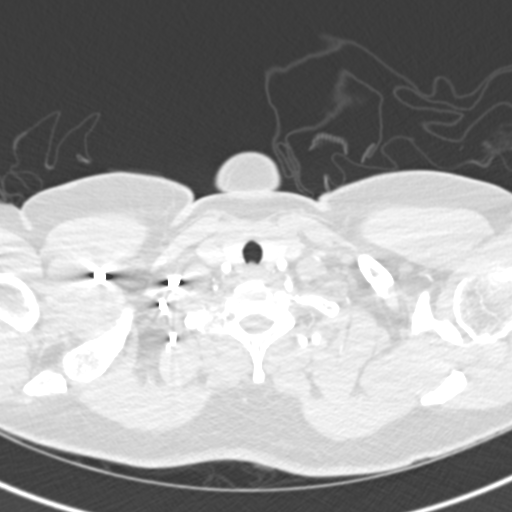

[Series 7: pe coronal mpr · coronal · 0.54mm/px · 1 of 109 slices shown]
[im 55/109  mediastinal]
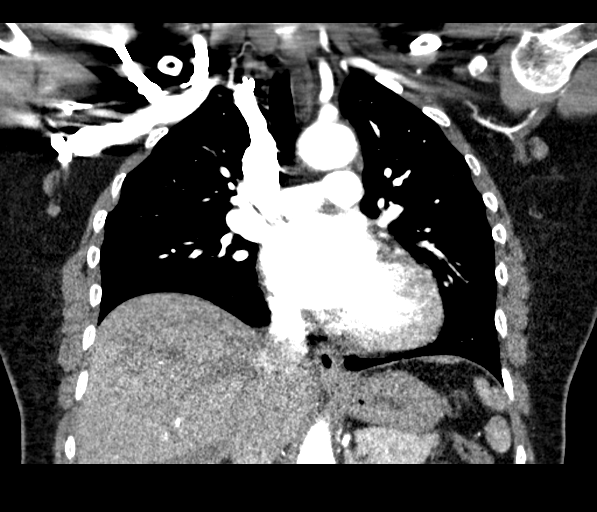

[18 of 36 positions shown; findings below may reference images not displayed]

FINDINGS: Cardiovascular: Segmental thrombus in the anteromedial segment left
lower lobe, image 67/7. This could be acute or chronic. Tiny
subsegmental filling defect in the right middle lobe pulmonary
embolus.

Aberrant right subclavian artery tracks behind the esophagus.

Mild cardiomegaly.

Mediastinum/Nodes: Enlarged right axillary and subpectoral lymph
nodes, a right axillary node measures 1.4 cm in short axis on image
1 28 series 4. I do not see an obvious right breast mass. An
adjacent right subpectoral lymph node measures 1.3 cm in short axis
on image [DATE]. Left axillary lymph nodes are considerably smaller.

Lungs/Pleura: Linear subsegmental atelectasis in the right lower
lobe.

Upper Abdomen: Scarring in the right kidney upper pole

Musculoskeletal: Unremarkable

Review of the MIP images confirms the above findings.
IMPRESSION: 1. There is a small amount of age indeterminate pulmonary embolus in
a segmental branch of the left lower lobe (anteromedial segment).
There is potentially also a tiny subsegmental marginal thrombus in
the right middle lobe. Right ventricular to left ventricular ratio
is 0.85, below the threshold for submassive pulmonary embolus.
2. Mild cardiomegaly. Aberrant right subclavian artery passes behind
the esophagus.
3. Mildly enlarged right axillary and subpectoral lymph nodes. I do
not see a breast mass but cause for this adenopathy is uncertain.
Physical exam follow up is recommended to ensure that these fully
resolve, if they do not think may warrant further workup to exclude
the possibility of malignancy. Also if the patient is not had recent
mammography then I do recommend a mammographic workup.
Critical Value/emergent results were called by telephone at the time
of interpretation on 08/16/2016 at [DATE] to Dr. LEMUS DURDEN ,
who verbally acknowledged these results.

## 2018-07-19 ENCOUNTER — Encounter: Payer: Managed Care, Other (non HMO) | Admitting: Genetic Counselor

## 2018-08-15 ENCOUNTER — Telehealth: Payer: Self-pay | Admitting: Obstetrics & Gynecology

## 2018-08-15 NOTE — Telephone Encounter (Signed)
Call to patient. States she is ready to proceed with surgery.  Daughter is doing very well and has returned to teaching.  Advised Dr Sabra Heck recommended PUS and CA 125 ( see previous office note) . Patient states she has already had ultrasound with her other GYN and that she needs hysterectomy. She has continued to have heavy bleeding and has fibroids.  Wants to have surgery with Dr Sabra Heck due to laparoscopic option.  Advised needs office visit to review records and discuss  Options. Appointment scheduled for 08-23-18. Declined earlier appointment.  Routing to provider for final review. Will close encounter.   Records from Dr Quitman Livings visible in [place].

## 2018-08-15 NOTE — Telephone Encounter (Signed)
Patient called wanting to schedule the removal of ovaries and a hysterectomy around November 23, 2018.

## 2018-08-23 ENCOUNTER — Encounter: Payer: Self-pay | Admitting: Obstetrics & Gynecology

## 2018-08-23 ENCOUNTER — Ambulatory Visit (INDEPENDENT_AMBULATORY_CARE_PROVIDER_SITE_OTHER): Payer: Managed Care, Other (non HMO) | Admitting: Obstetrics & Gynecology

## 2018-08-23 ENCOUNTER — Other Ambulatory Visit: Payer: Self-pay

## 2018-08-23 VITALS — BP 132/70 | HR 68 | Resp 16 | Ht 61.0 in | Wt 158.4 lb

## 2018-08-23 DIAGNOSIS — Z1501 Genetic susceptibility to malignant neoplasm of breast: Secondary | ICD-10-CM

## 2018-08-23 DIAGNOSIS — D251 Intramural leiomyoma of uterus: Secondary | ICD-10-CM

## 2018-08-23 DIAGNOSIS — N926 Irregular menstruation, unspecified: Secondary | ICD-10-CM | POA: Diagnosis not present

## 2018-08-23 DIAGNOSIS — Z86711 Personal history of pulmonary embolism: Secondary | ICD-10-CM | POA: Diagnosis not present

## 2018-08-23 DIAGNOSIS — Z1509 Genetic susceptibility to other malignant neoplasm: Secondary | ICD-10-CM

## 2018-08-23 NOTE — Progress Notes (Addendum)
Claudia Rivas  VISIT  CC:   Discuss surgical options  HPI: 47 y.o. 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  This testing was done December 20, 2017, after her daughter, at age 25, was diagnosed with breast cancer.  She also is a carrier for the BRCA gene.  The testing, done with Claudia Rivas, was positive for the BRCA1 [id number] variant.  Her family history is significant for breast cancer in her mother who was diagnosed at age 20, recurrent at age 66, and deceased at age 12, her maternal aunt who was diagnosed at age 59 and died that same year, and her daughter.  Of note, her daughter is doing quite well and is completed all of her treatment.  She has been with her a lot of the summer.  I saw Claudia Rivas in February and we discussed treatment options.  We discussed both bilateral salpingo-oophorectomy with or without hysterectomy.  At that time she is leaning toward just a BSO.  She does have fibroids.  She has been experiencing irregular bleeding during the last year.  At times, the bleeding has been heavy.  She has seen another gynecologist, Dr. Quentin Rivas, in the last year as well.  In July she had an ultrasound that showed a uterus measuring 13 x 9.5 x 0.6 cm.  The endometrium is 9.1 mm.  Left ovary was 4.8 x 3.7 x 2.6 cm and the right ovary was 2.5 x 2.3 x 1.5 cm.  There are at least 11 fibroids measured with the largest one measuring 2 x 2 cm.  There is also an avascular 4.3 x 3.8 cm area in the lower uterine segment that was suspicious for a degenerating fibroid.  Ovaries and tubes did appear normal.  The note from to stay July acute she had an endometrial biopsy but I do not have these results and I reviewed this completely in care everywhere.  Patient did have an IUD placed November 18, 2016 for her bleeding.  This was expelled on 12/16/2016.  The patient's history is also significant for bilateral  pulmonary emboli diagnosed August 16, 2016.  She had an emboli in the segmental branch of the left lower lobe of the lung that is where as well as a small subsegmental marginal thrombus in the right middle lobe.  She is currently on Xarelto but will stop this next month with Dr. Marin Rivas.  We discussed today her bleeding profile.  Her bleeding will very likely improve once Xarelto is stopped.    We discussed today laparoscopic bilateral salpingo-oophorectomy versus total laparoscopic hysterectomy with bilateral salpingo-oophorectomy.  She does have fibroids and irregular bleeding, she is a candidate for both procedures.  My biggest concern for her on a longer surgery is her history of pulmonary emboli and the need for longer anticoagulation.  This does increase her risk of postoperative bleeding.  I reviewed with the patient that after her ovaries are removed, her bleeding will stop her fibroids will likely shrink she will be in surgical menopause.  Long-term risk of malignancy related to fibroids also reviewed.  I did review her genetic testing and her genetic abnormality is increased risks for breast and ovarian/fallopian tube/peritoneal cancer.  I do not feel leaving her uterus going to put her at any increased risks and may diminish her surgical risks.  Of course, if she has any future issues with her fibroid uterus, we can always come  back and plan to remove it.  2 surgeries, Rivas stay, pain management, recovery, tomato work was all discussed.  Risks and benefits were also reviewed.  These include but are not limited to bowel, bladder, ureteral injury, bleeding, need for transfusion, vascular injury, hernia formation, DVT/PE, infection, and a risk of death.  After this was reviewed, she feels laparoscopic BSO is the right decision for her at this time.  This is my recommendation as well.  GYNECOLOGIC HISTORY: Patient's last menstrual period was 05/24/2018 (approximate). Contraception:  Nothing Menopausal hormone therapy: none  Patient Active Problem List   Diagnosis Date Noted  . Genetic testing 12/19/2017  . BRCA1 positive 12/19/2017  . Family history of breast cancer   . Family history of BRCA1 gene positive   . Sickle cell trait (Claudia Rivas) 09/15/2016  . Displacement of intrauterine contraceptive device 08/31/2016  . Microcytic anemia 08/17/2016  . Hypokalemia 08/17/2016  . Iron deficiency anemia 08/17/2016  . GERD (gastroesophageal reflux disease)   . Pulmonary embolism with acute cor pulmonale (Claudia Rivas) 08/16/2016  . Hemorrhoids 12/02/2015  . Adjustment disorder with depressed mood 11/28/2015  . 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 Benign neoplasm of colon 11/28/2015    Past Medical History:  Diagnosis Date  . Diverticulitis   . Family history of BRCA1 gene positive   . Family history of breast cancer   . Fibroids   . GERD (gastroesophageal reflux disease)   . Headache    "monthly" (08/17/2016)  . Iron deficiency anemia    received iron transfusion 08/17/2016  . Pneumonia 08/2009  . Pulmonary embolism (Claudia Rivas) 08/16/2016  . Sickle cell trait Claudia Rivas)     Past Surgical History:  Procedure Laterality Date  . BREAST BIOPSY Right     MEDS:   Current Outpatient Medications on File Prior to Visit  Medication Sig Dispense Refill  . acetaminophen (TYLENOL) 325 MG tablet Take 650 mg by mouth every 6 (six) hours as needed for mild pain.    . cholecalciferol (VITAMIN D) 1000 units tablet Take 1,000 Units by mouth daily.    . folic acid (FOLVITE) 1 MG tablet Take 1 tablet (1 mg total) by mouth daily. 90 tablet 3  . norethindrone (AYGESTIN) 5 MG tablet Take 5 mg by mouth daily.    . rivaroxaban (XARELTO) 10 MG TABS tablet Take 1 tablet (10 mg total) by mouth daily with supper. 90 tablet 3   No current facility-administered medications on file prior to visit.     ALLERGIES: Latex  Family History  Problem Relation Age of Onset  . Crohn's disease Mother   . Breast cancer  Mother 63       recurrance at 73, died at 13  . BRCA 1/2 Mother   . Crohn's disease Sister   . Diverticulitis Brother   . Breast cancer Daughter 81  . BRCA 1/2 Daughter        BRCA1 [id number] (Splice donor)  . Breast cancer Maternal Aunt 53  . BRCA 1/2 Maternal Aunt   . Breast cancer Maternal Aunt 67       dx stage IV, lived 3 more weeks  . Heart attack Maternal Grandmother   . Cervical cancer Sister 70       pat 1/2 sister, died at 8    SH:  Married, non smoker  Review of Systems  Genitourinary: Positive for menstrual problem and vaginal bleeding.       Unscheduled bleeding or spotting   All other systems reviewed and  are negative.   PHYSICAL EXAMINATION:    BP 132/70 (BP Location: Right Arm, Patient Position: Sitting, Cuff Size: Normal)   Pulse 68   Resp 16   Ht _0  (1.549 m)   Wt 158 lb 6.4 oz (71.8 kg)   LMP 05/24/2018 (Approximate)   BMI 29.93 kg/m      General appearance: alert, cooperative and appears stated age Abdomen: soft, non-tender; bowel sounds normal; no masses,  no organomegaly Lymph:  no inguinal LAD noted  Pelvic: External genitalia:  no lesions              Urethra:  normal appearing urethra with no masses, tenderness or lesions              Bartholins and Skenes: normal                 Vagina: normal appearing vagina with normal color and discharge, no lesions              Cervix: no lesions              Bimanual Exam:  Uterus:  enlarged, 12 weeks size , mobile and nodular c/w fibroid uterus              Anus:  normal sphincter tone, no lesions  Endometrial biopsy recommended.  Discussed with patient.  Verbal and written consent obtained.   Procedure:  Speculum placed.  Cervix visualized and cleansed with betadine prep.  A single toothed tenaculum was applied to the anterior lip of the cervix.  Endometrial pipelle was advanced through the cervix into the endometrial cavity without difficulty.  Pipelle passed to 10cm.  Suction applied and  pipelle removed with good tissue sample obtained.  Tenculum removed.  No bleeding noted.  Patient tolerated procedure well.  Chaperone was present for exam.  Assessment: Irregular bleeding Fibroid uterus H/O BRCA 1 gene mutation with strong family hx of breast cancer Irregular bleeding H/O PE Latex allergy  Plan: Will proceed with scheduling laparoscopic BSO.  Would recommend PUS just prior to surgery to ensure normal ovaries. Endometrial biopsy obtained today as well.  Results will be called to pt.  Neg pap with neg HR HPV was obtained 2/19.   ~25 minutes spent with patient >50% of time was in face to face discussion of above.

## 2018-08-27 ENCOUNTER — Other Ambulatory Visit: Payer: Self-pay | Admitting: Hematology & Oncology

## 2018-08-27 DIAGNOSIS — I2609 Other pulmonary embolism with acute cor pulmonale: Secondary | ICD-10-CM

## 2018-08-27 DIAGNOSIS — N921 Excessive and frequent menstruation with irregular cycle: Secondary | ICD-10-CM

## 2018-08-27 DIAGNOSIS — D500 Iron deficiency anemia secondary to blood loss (chronic): Secondary | ICD-10-CM

## 2018-08-27 DIAGNOSIS — Z1231 Encounter for screening mammogram for malignant neoplasm of breast: Secondary | ICD-10-CM

## 2018-08-29 ENCOUNTER — Encounter: Payer: Self-pay | Admitting: Hematology & Oncology

## 2018-08-29 ENCOUNTER — Other Ambulatory Visit: Payer: Self-pay

## 2018-08-29 ENCOUNTER — Inpatient Hospital Stay: Payer: Managed Care, Other (non HMO)

## 2018-08-29 ENCOUNTER — Inpatient Hospital Stay: Payer: Managed Care, Other (non HMO) | Attending: Family | Admitting: Hematology & Oncology

## 2018-08-29 VITALS — BP 131/80 | HR 91 | Temp 98.3°F | Resp 20 | Wt 160.0 lb

## 2018-08-29 DIAGNOSIS — I2782 Chronic pulmonary embolism: Secondary | ICD-10-CM

## 2018-08-29 DIAGNOSIS — Z7901 Long term (current) use of anticoagulants: Secondary | ICD-10-CM | POA: Diagnosis not present

## 2018-08-29 DIAGNOSIS — Z86711 Personal history of pulmonary embolism: Secondary | ICD-10-CM | POA: Diagnosis not present

## 2018-08-29 DIAGNOSIS — D509 Iron deficiency anemia, unspecified: Secondary | ICD-10-CM | POA: Insufficient documentation

## 2018-08-29 DIAGNOSIS — Z7982 Long term (current) use of aspirin: Secondary | ICD-10-CM | POA: Diagnosis not present

## 2018-08-29 DIAGNOSIS — Z79899 Other long term (current) drug therapy: Secondary | ICD-10-CM | POA: Diagnosis not present

## 2018-08-29 DIAGNOSIS — D573 Sickle-cell trait: Secondary | ICD-10-CM

## 2018-08-29 DIAGNOSIS — I2601 Septic pulmonary embolism with acute cor pulmonale: Secondary | ICD-10-CM

## 2018-08-29 DIAGNOSIS — R599 Enlarged lymph nodes, unspecified: Secondary | ICD-10-CM | POA: Insufficient documentation

## 2018-08-29 DIAGNOSIS — Z1501 Genetic susceptibility to malignant neoplasm of breast: Secondary | ICD-10-CM | POA: Diagnosis not present

## 2018-08-29 DIAGNOSIS — D500 Iron deficiency anemia secondary to blood loss (chronic): Secondary | ICD-10-CM

## 2018-08-29 LAB — FERRITIN: FERRITIN: 42 ng/mL (ref 11–307)

## 2018-08-29 LAB — CBC WITH DIFFERENTIAL (CANCER CENTER ONLY)
BASOS ABS: 0 10*3/uL (ref 0.0–0.1)
BASOS PCT: 0 %
Eosinophils Absolute: 0.1 10*3/uL (ref 0.0–0.5)
Eosinophils Relative: 1 %
HCT: 36.6 % (ref 34.8–46.6)
HEMOGLOBIN: 12.6 g/dL (ref 11.6–15.9)
LYMPHS PCT: 34 %
Lymphs Abs: 2.1 10*3/uL (ref 0.9–3.3)
MCH: 29.4 pg (ref 26.0–34.0)
MCHC: 34.4 g/dL (ref 32.0–36.0)
MCV: 85.5 fL (ref 81.0–101.0)
Monocytes Absolute: 0.3 10*3/uL (ref 0.1–0.9)
Monocytes Relative: 5 %
NEUTROS PCT: 60 %
Neutro Abs: 3.6 10*3/uL (ref 1.5–6.5)
Platelet Count: 254 10*3/uL (ref 145–400)
RBC: 4.28 MIL/uL (ref 3.70–5.32)
RDW: 13.4 % (ref 11.1–15.7)
WBC Count: 6.1 10*3/uL (ref 3.9–10.0)

## 2018-08-29 LAB — CMP (CANCER CENTER ONLY)
ALBUMIN: 3.5 g/dL (ref 3.5–5.0)
ALT: 20 U/L (ref 10–47)
AST: 22 U/L (ref 11–38)
Alkaline Phosphatase: 55 U/L (ref 26–84)
Anion gap: 5 (ref 5–15)
BUN: 5 mg/dL — AB (ref 7–22)
CALCIUM: 8.8 mg/dL (ref 8.0–10.3)
CO2: 23 mmol/L (ref 18–33)
CREATININE: 1 mg/dL (ref 0.60–1.20)
Chloride: 115 mmol/L — ABNORMAL HIGH (ref 98–108)
Glucose, Bld: 132 mg/dL — ABNORMAL HIGH (ref 73–118)
Potassium: 3.7 mmol/L (ref 3.3–4.7)
SODIUM: 143 mmol/L (ref 128–145)
TOTAL PROTEIN: 7.2 g/dL (ref 6.4–8.1)
Total Bilirubin: 0.5 mg/dL (ref 0.2–1.6)

## 2018-08-29 LAB — IRON AND TIBC
Iron: 60 ug/dL (ref 41–142)
Saturation Ratios: 18 % — ABNORMAL LOW (ref 21–57)
TIBC: 329 ug/dL (ref 236–444)
UIBC: 268 ug/dL

## 2018-08-29 LAB — D-DIMER, QUANTITATIVE: D-Dimer, Quant: <0.27 ug/mL-FEU (ref 0.00–0.50)

## 2018-08-29 MED ORDER — ASPIRIN EC 81 MG PO TBEC: 162.0000 mg | DELAYED_RELEASE_TABLET | Freq: Every day | ORAL | 6 refills | Status: AC

## 2018-08-29 NOTE — Progress Notes (Signed)
Hematology and Oncology Follow Up Visit  Claudia Rivas [id number] 01-31-1971 47 y.o. 08/29/2018   Principle Diagnosis:  Bilateral pulmonary emboli Chronic subpectoral lymphadenopathy Sickle Cell trait Iron deficiency anemia  BRCA1 (+)  Current Therapy:   Xarelto 10 mg by mouth daily maintenance therapy -willcomplete September 2019  EC ASA 162 mg po q day -- start 12/20/3233 Folic Acid 1 mg po q day IV iron as indicated - last received in November 2018 x 2   Interim History:  Claudia Rivas is here today for follow-up.  Surprisingly, she has gotten a very nice new job.  She now works for [employer].  I am so happy for her.  She seems to be enjoying it there.  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  She now knows when she is going to have surgery.  She will have her ovaries taken out in December.  She will have surgery on December 16.  For surgery, I think we probably should get her back on Xarelto just for a month.  I will set up her appointment to see me back about 2 weeks before she has surgery.  Otherwise, she is doing okay.  She is had a good summer.  She has had no obvious problems.  She is had some bleeding from the fibroids.  Her iron studies today show a ferritin of 42 with an iron saturation of 18%.  Currently, her performance status is ECOG 1.  Medications:  Allergies as of 08/29/2018      Reactions   Latex Rash      Medication List        Accurate as of 08/29/18  9:49 AM. Always use your most recent med list.          acetaminophen 325 MG tablet Commonly known as:  TYLENOL Take 650 mg by mouth every 6 (six) hours as needed for mild pain.   cholecalciferol 1000 units tablet Commonly known as:  VITAMIN D Take 1,000 Units by mouth daily.   FERREX 150 150 MG capsule Generic drug:  iron polysaccharides TAKE 1 CAPSULE TWICE A DAY   folic acid 1 MG tablet Commonly known as:  FOLVITE Take 1 tablet (1  mg total) by mouth daily.   norethindrone 5 MG tablet Commonly known as:  AYGESTIN Take 5 mg by mouth daily.   rivaroxaban 10 MG Tabs tablet Commonly known as:  XARELTO Take 1 tablet (10 mg total) by mouth daily with supper.       Allergies:  Allergies  Allergen Reactions  . Latex Rash    Past Medical History, Surgical history, Social history, and Family History were reviewed and updated.  Review of Systems: Review of Systems  Constitutional: Negative.   HENT: Negative.   Eyes: Negative.   Respiratory: Negative.   Cardiovascular: Negative.   Gastrointestinal: Negative.   Genitourinary: Negative.   Musculoskeletal: Negative.   Skin: Negative.   Neurological: Negative.   Endo/Heme/Allergies: Negative.   Psychiatric/Behavioral: Negative.      Physical Exam:  weight is 160 lb (72.6 kg). Her oral temperature is 98.3 F (36.8 C). Her blood pressure is 131/80 and her pulse is 91. Her respiration is 20 and oxygen saturation is 100%.   Wt Readings from Last 3 Encounters:  08/29/18 160 lb (72.6 kg)  08/23/18 158 lb 6.4 oz (71.8 kg)  06/20/18 162 lb 12.8 oz (73.8 kg)    Physical Exam  Constitutional: She is oriented  to person, place, and time.  HENT:  Head: Normocephalic and atraumatic.  Mouth/Throat: Oropharynx is clear and moist.  Eyes: Pupils are equal, round, and reactive to light. EOM are normal.  Neck: Normal range of motion.  Cardiovascular: Normal rate, regular rhythm and normal heart sounds.  Pulmonary/Chest: Effort normal and breath sounds normal.  Abdominal: Soft. Bowel sounds are normal.  Musculoskeletal: Normal range of motion. She exhibits no edema, tenderness or deformity.  Lymphadenopathy:    She has no cervical adenopathy.  Neurological: She is alert and oriented to person, place, and time.  Skin: Skin is warm and dry. No rash noted. No erythema.  Psychiatric: She has a normal mood and affect. Her behavior is normal. Judgment and thought content  normal.  Vitals reviewed.    Lab Results  Component Value Date   WBC 6.1 08/29/2018   HGB 12.6 08/29/2018   HCT 36.6 08/29/2018   MCV 85.5 08/29/2018   PLT 254 08/29/2018   Lab Results  Component Value Date   FERRITIN 79 06/20/2018   IRON 52 06/20/2018   TIBC 331 06/20/2018   UIBC 279 06/20/2018   IRONPCTSAT 16 (L) 06/20/2018   Lab Results  Component Value Date   RETICCTPCT 2.1 06/20/2018   RBC 4.28 08/29/2018   No results found for: KPAFRELGTCHN, LAMBDASER, KAPLAMBRATIO No results found for: IGGSERUM, IGA, IGMSERUM No results found for: Odetta Pink, SPEI   Chemistry      Component Value Date/Time   NA 144 06/20/2018 0848   NA 143 12/11/2017 1011   K 3.8 06/20/2018 0848   K 4.0 12/11/2017 1011   CL 108 06/20/2018 0848   CL 111 (H) 10/11/2017 0847   CO2 24 06/20/2018 0848   CO2 25 12/11/2017 1011   BUN 8 06/20/2018 0848   BUN 11.2 12/11/2017 1011   CREATININE 0.70 06/20/2018 0848   CREATININE 0.9 12/11/2017 1011      Component Value Date/Time   CALCIUM 8.6 06/20/2018 0848   CALCIUM 9.4 12/11/2017 1011   ALKPHOS 48 06/20/2018 0848   ALKPHOS 70 12/11/2017 1011   AST 22 06/20/2018 0848   AST 15 12/11/2017 1011   ALT 22 06/20/2018 0848   ALT 14 12/11/2017 1011   BILITOT 0.5 06/20/2018 0848   BILITOT 0.43 12/11/2017 1011      Impression and Plan: Claudia Rivas is a pleasant 47 yo African American female with history of PE, sickle cell trait, iron deficiency anemia and BRCA1 (+).   We will now stop the Xarelto.  She will go on baby aspirin at 162 mg daily.  I told her to take coated aspirin daily.  She will take it in the morning with food.  Her iron studies are still a little bit low.  I will give her a dose of iron just to make sure that we keep her iron stores stocked until she has her surgery.  I am happy that she has a new job.  This will make life a lot easier for her.  Again, I will plan to  see her back in early December.  Volanda Napoleon, MD 9/18/20199:49 AM

## 2018-08-29 NOTE — Addendum Note (Signed)
Addended by: Burney Gauze R on: 08/29/2018 05:26 PM   Modules accepted: Orders

## 2018-08-30 ENCOUNTER — Telehealth: Payer: Self-pay | Admitting: *Deleted

## 2018-08-30 NOTE — Telephone Encounter (Addendum)
Patient aware of results and recommendations  ----- Message from Volanda Napoleon, MD sent at 08/30/2018  6:51 AM EDT ----- Call - iron is a tad low, but coming up. Keep eating well!!  pete

## 2018-09-05 ENCOUNTER — Telehealth: Payer: Self-pay | Admitting: Obstetrics & Gynecology

## 2018-09-05 NOTE — Telephone Encounter (Signed)
Spoke with patient regarding benefit for surgery. Patient understood and agreeable. Patient aware this is professional benefit only. Patient aware will be contacted by hospital for separate benefits, once surgery has been scheduled.   Patient advises she will call back to confirm and proceed with scheduling   cc: Lamont Snowball, RN

## 2018-09-06 NOTE — Telephone Encounter (Signed)
Patient called in regarding surgery date. Return call. Desires to proceed on 11-26-18 as previously discussed with Dr Sabra Heck.  Advised will proceed with scheduling and will she will need consult approximately 3 weeks prior. Patient requests call back to schedule closer to surgery.   Encounter closed.

## 2018-09-19 ENCOUNTER — Other Ambulatory Visit: Payer: Self-pay | Admitting: Obstetrics & Gynecology

## 2018-09-19 ENCOUNTER — Encounter: Payer: Self-pay | Admitting: Obstetrics & Gynecology

## 2018-10-18 ENCOUNTER — Telehealth: Payer: Self-pay | Admitting: *Deleted

## 2018-10-18 NOTE — Telephone Encounter (Signed)
Detailed message left per DPR advising patient RN needs to review Surgery Information Form and schedule pre and post op appointments. Advised could ask to speak to [place].

## 2018-10-23 NOTE — Telephone Encounter (Signed)
Detailed message left on mobile number per DPR advising patient of need to review Surgery Information Form and schedule pre and post op instructions. Advised patient could ask to speak to [facility].

## 2018-10-24 ENCOUNTER — Telehealth: Payer: Self-pay | Admitting: Obstetrics & Gynecology

## 2018-10-24 NOTE — Telephone Encounter (Signed)
Detailed message left per DPR. Reviewed with nursing supervisor who states patient requested to be scheduled for appointments and called with dates. RN advised patient had been scheduled for PUS and pre op appointment with Dr. Sabra Heck on 11-01-18. PUS at 1500, consult at 1530. Post op appointment scheduled for 12-11-18 at 1530. Advised patient to please return call to office if any conflicts with these appointment dates/times, otherwise would see her for her PUS/pre op on 11-01-18 at 1500. Advised would review surgery information form at appointment.  Routing to provider and will close encounter.   CC Lamont Snowball, RN

## 2018-10-24 NOTE — Telephone Encounter (Signed)
Patient is returning Claudia Rivas's call.

## 2018-10-24 NOTE — Telephone Encounter (Signed)
Patient returned call. See telephone encounter dated 10-18-18. Patient states dates RN scheduled will not work for her job schedule. Patient PUS with pre op rescheduled to 11-15-18 at 1400. Patient agreeable to date and time of appointment. Post op rescheduled to 12-13-18 at 1445. Patient agreeable to date and time of appointment. Surgery information form reviewed with patient and she verbalized understanding. Patient aware will receive copy at pre op appointment.   Routing to provider and will close encounter.   CC Lamont Snowball, RN

## 2018-11-01 ENCOUNTER — Other Ambulatory Visit: Payer: Managed Care, Other (non HMO) | Admitting: Obstetrics & Gynecology

## 2018-11-01 ENCOUNTER — Other Ambulatory Visit: Payer: Managed Care, Other (non HMO)

## 2018-11-05 ENCOUNTER — Other Ambulatory Visit: Payer: Self-pay | Admitting: Hematology & Oncology

## 2018-11-07 NOTE — Patient Instructions (Addendum)
Your procedure is scheduled on:  Monday, 12/16  Enter through the Main Entrance of [facility] at: 6 am  Pick up the phone at the desk and dial [phone].  Call this number if you have problems the morning of surgery: [phone].  Remember: Do NOT eat food or Do NOT drink clear liquids (including water) after midnight Sunday.  Take these medicines the morning of surgery with a SIP OF WATER: None  Brush your teeth on the day of surgery.  Stop herbal medications, vitamin supplements, Ibuprofen/NSAIDS 1 week prior to surgery - Monday 11/19/18.  Do NOT wear jewelry (body piercing), metal hair clips/bobby pins, make-up, or nail polish. Do NOT wear lotions, powders, or perfumes.  You may wear deoderant. Do NOT shave for 48 hours prior to surgery. Do NOT bring valuables to the hospital.  Have a responsible adult drive you home and stay with you for 24 hours after your procedure.  Home with Husband Claudia Rivas cell [phone].

## 2018-11-14 ENCOUNTER — Other Ambulatory Visit: Payer: Managed Care, Other (non HMO)

## 2018-11-14 ENCOUNTER — Ambulatory Visit: Payer: Managed Care, Other (non HMO) | Admitting: Hematology & Oncology

## 2018-11-15 ENCOUNTER — Ambulatory Visit (INDEPENDENT_AMBULATORY_CARE_PROVIDER_SITE_OTHER): Payer: Managed Care, Other (non HMO) | Admitting: Obstetrics & Gynecology

## 2018-11-15 ENCOUNTER — Encounter: Payer: Self-pay | Admitting: Obstetrics & Gynecology

## 2018-11-15 ENCOUNTER — Telehealth: Payer: Self-pay | Admitting: Obstetrics & Gynecology

## 2018-11-15 ENCOUNTER — Ambulatory Visit (INDEPENDENT_AMBULATORY_CARE_PROVIDER_SITE_OTHER): Payer: Managed Care, Other (non HMO)

## 2018-11-15 ENCOUNTER — Other Ambulatory Visit: Payer: Self-pay | Admitting: *Deleted

## 2018-11-15 VITALS — BP 118/80 | HR 84 | Resp 16 | Ht 61.0 in | Wt 158.0 lb

## 2018-11-15 DIAGNOSIS — D251 Intramural leiomyoma of uterus: Secondary | ICD-10-CM

## 2018-11-15 DIAGNOSIS — Z1371 Encounter for nonprocreative screening for genetic disease carrier status: Secondary | ICD-10-CM

## 2018-11-15 DIAGNOSIS — N926 Irregular menstruation, unspecified: Secondary | ICD-10-CM | POA: Diagnosis not present

## 2018-11-15 DIAGNOSIS — Z86711 Personal history of pulmonary embolism: Secondary | ICD-10-CM

## 2018-11-15 DIAGNOSIS — R9389 Abnormal findings on diagnostic imaging of other specified body structures: Secondary | ICD-10-CM

## 2018-11-15 NOTE — Progress Notes (Signed)
47 y.o. [id number] Married Claudia Rivas here for discussion of upcoming procedure.  Laparoscopic BSO is planned on 11/26/18 due to BRCA 1 positive testing.  Testing was done 12/13/17.  This was identified after her daughter was diagnosed with breast cancer and was BRCA positive.  Pt has never had breast cancer but did have a PE 8/18.  She was on Xarelto for a year.  Evaluation was negative.  She is now on an aspirin.  She has been advised to stop this pre-op.  She is concerned about this and wants me to be sure with Dr. Marin Olp that this is recommended.  Pt aware that surgery cannot be done if on an ASA prior to procedure.  Still she wants me to be sure.  She does have known hx of fibroids and heavy bleeding at times.  Cycles are much less regular.  She has discussed hysterectomy with me.  Given the hx of PE and need for long anti-coagulation as well as increased DVT/PE risks, I really feel that laparoscopic BSO is the better choice for this pt.  She is aware she will be surgically menopausal after her surgery and that she should have no future bleeding. In addition, her fibroids should shrink.   Ultrasound was performed today specifically to recheck ovaries as pt decided on surgery months ago but delayed until this late in the year due to her daughter's recovery from breast cancer.  Ca-125 will be obtained today as well.  Uterus:  9.6 x 11cm with multiple fibroids measuring 1.7cm up to 3.9cm.  At least 9 are noted.   Endometrium:  Possible endometrial mass noted measuring 7x41m Left ovary:  2.8 x 2.0 x 1.5cm Right ovary:  3.0 x 2.1 x 1.6cm Cul de sac:  No free fluid  Procedure discussed with patient.  Hospital stay, recovery and pain management all discussed.  Risks discussed including but not limited to bleeding, 1% risk of receiving a  transfusion, infection, 3-4% risk of bowel/bladder/ureteral/vascular injury discussed as well as possible need for additional surgery if injury does  occur discussed.  DVT/PE and rare risk of death discussed.  My actual complications with prior surgeries discussed. Hernia formation discussed.  Positioning and incision locations discussed.  Patient aware if pathology abnormal she may need additional treatment.  All questions answered.    Ob Hx:   Patient's last menstrual period was 11/07/2018.          Sexually active: Yes.   Birth control: no method Last pap: 01/26/18 Neg. HR HPV:neg  Last MMG: MRI 04/17/18 BIRADS1:neg.  Tobacco: No  Past Surgical History:  Procedure Laterality Date  . BREAST BIOPSY Right     Past Medical History:  Diagnosis Date  . BRCA1 gene mutation positive   . Diverticulitis   . Family history of BRCA1 gene positive   . Family history of breast cancer   . Fibroids   . GERD (gastroesophageal reflux disease)   . Headache    "monthly" (08/17/2016)  . Iron deficiency anemia    received iron transfusion 08/17/2016  . Pneumonia 08/2009  . [place]ism [place] 08[place]16[place]  . Sickle cell trait (HCC)     Allergies: Latex  Current Outpatient Medications  Medication Sig Dispense Refill  . acetaminophen (TYLENOL) 325 MG tablet Take 650 mg by mouth every 6 (six) hours as needed for mild pain.    .Marland Kitchenaspirin EC 81 MG tablet Take 2 tablets (162 mg total) by mouth daily. 180 tablet 6  .  cholecalciferol (VITAMIN D) 1000 units tablet Take 1,000 Units by mouth daily.    . folic acid (FOLVITE) 1 MG tablet Take 1 tablet (1 mg total) by mouth daily. 90 tablet 3  . norethindrone (AYGESTIN) 5 MG tablet Take 5 mg by mouth daily.     No current facility-administered medications for this visit.     ROS: A comprehensive review of systems was negative.  Exam:    BP 118/80 (BP Location: Right Arm, Patient Position: Sitting, Cuff Size: Large)   Pulse 84   Resp 16   Ht '5\' 1"'  (1.549 m)   Wt 158 lb (71.7 kg)   LMP 11/07/2018   BMI 29.85 kg/m   General appearance: alert and cooperative Head: Normocephalic, without obvious  abnormality, atraumatic Neck: no adenopathy, supple, symmetrical, trachea midline and thyroid not enlarged, symmetric, no tenderness/mass/nodules Lungs: clear to auscultation bilaterally Heart: regular rate and rhythm, S1, S2 normal, no murmur, click, rub or gallop Abdomen: soft, non-tender; bowel sounds normal; no masses,  no organomegaly Extremities: extremities normal, atraumatic, no cyanosis or edema Skin: Skin color, texture, turgor normal. No rashes or lesions Lymph nodes: Cervical, supraclavicular, and axillary nodes normal. no inguinal nodes palpated Neurologic: Grossly normal  A: Fibroid uterus BRCA 1 positive testing H/O PE 8/18, on Xarelto x 1 year, now on ASA Possible endometrial mass    P:  Laparoscopic BSO planned.  Will add hysteroscopy to this as well. Pt knows to stop ASA but will confirm with Dr. Marin Olp She will be treated with Lovenox day of procedure Ca 125 obtained today  ~20 minutes spent with patient >50% of time was in face to face discussion of above.

## 2018-11-15 NOTE — Telephone Encounter (Signed)
Patient has multiple questions about her upcoming upcoming surgery.

## 2018-11-15 NOTE — Telephone Encounter (Signed)
Return call to patient. Questions about appointment for tomorrow.  Advised of PAT appointment at [facility]. Time and instructions reviewed.   Encounter closed.

## 2018-11-16 ENCOUNTER — Other Ambulatory Visit: Payer: Self-pay

## 2018-11-16 ENCOUNTER — Encounter (HOSPITAL_COMMUNITY)
Admission: RE | Admit: 2018-11-16 | Discharge: 2018-11-16 | Disposition: A | Discharge status: Home / Self Care | Payer: Managed Care, Other (non HMO) | Source: Ambulatory Visit | Attending: Obstetrics & Gynecology | Admitting: Obstetrics & Gynecology

## 2018-11-16 ENCOUNTER — Inpatient Hospital Stay (HOSPITAL_COMMUNITY): Admission: RE | Admit: 2018-11-16 | Payer: Managed Care, Other (non HMO) | Source: Ambulatory Visit

## 2018-11-16 ENCOUNTER — Encounter (HOSPITAL_COMMUNITY): Payer: Self-pay

## 2018-11-16 DIAGNOSIS — Z01812 Encounter for preprocedural laboratory examination: Secondary | ICD-10-CM | POA: Diagnosis not present

## 2018-11-16 LAB — CBC
HEMATOCRIT: 39 % (ref 36.0–46.0)
Hemoglobin: 13.3 g/dL (ref 12.0–15.0)
MCH: 28.7 pg (ref 26.0–34.0)
MCHC: 34.1 g/dL (ref 30.0–36.0)
MCV: 84.2 fL (ref 80.0–100.0)
Platelets: 318 10*3/uL (ref 150–400)
RBC: 4.63 MIL/uL (ref 3.87–5.11)
RDW: 13.9 % (ref 11.5–15.5)
WBC: 4.9 10*3/uL (ref 4.0–10.5)
nRBC: 0 % (ref 0.0–0.2)

## 2018-11-16 LAB — BASIC METABOLIC PANEL
Anion gap: 7 (ref 5–15)
BUN: 8 mg/dL (ref 6–20)
CHLORIDE: 110 mmol/L (ref 98–111)
CO2: 23 mmol/L (ref 22–32)
Calcium: 8.5 mg/dL — ABNORMAL LOW (ref 8.9–10.3)
Creatinine, Ser: 0.87 mg/dL (ref 0.44–1.00)
GFR calc Af Amer: >60 mL/min (ref >60)
GFR calc non Af Amer: >60 mL/min (ref >60)
GLUCOSE: 149 mg/dL — AB (ref 70–99)
Potassium: 3.4 mmol/L — ABNORMAL LOW (ref 3.5–5.1)
Sodium: 140 mmol/L (ref 135–145)

## 2018-11-16 LAB — CA 125: Cancer Antigen (CA) 125: 5.3 U/mL (ref 0.0–38.1)

## 2018-11-16 NOTE — Anesthesia Preprocedure Evaluation (Addendum)
Anesthesia Evaluation  Patient identified by MRN, date of birth, ID band Patient awake    Reviewed: Allergy & Precautions, NPO status , Patient's Chart, lab work & pertinent test results  Airway Mallampati: II  TM Distance: >3 FB Neck ROM: Full    Dental no notable dental hx.    Pulmonary pneumonia, resolved, PE   Pulmonary exam normal breath sounds clear to auscultation       Cardiovascular + DVT  Normal cardiovascular exam Rhythm:Regular Rate:Normal  Hx/o PTE   Neuro/Psych PSYCHIATRIC DISORDERS Adjustment disorder with depressed moodnegative neurological ROS  negative psych ROS   GI/Hepatic Neg liver ROS, GERD  Medicated,  Endo/Other  BRCA1 positive  Renal/GU negative Renal ROS  negative genitourinary   Musculoskeletal negative musculoskeletal ROS (+)   Abdominal   Peds negative pediatric ROS (+)  Hematology  (+) Sickle cell trait and anemia , Hx/o PTE 2017, was on Xarelto for 2 years, now on 2- 7m ASA QD. Work up by hematology negative    Anesthesia Other Findings   Reproductive/Obstetrics negative OB ROS Endometrial polyp BRCA 1 positive                            Anesthesia Physical Anesthesia Plan  ASA: II  Anesthesia Plan: General   Post-op Pain Management:    Induction: Intravenous  PONV Risk Score and Plan: Treatment may vary due to age or medical condition, Ondansetron, Dexamethasone and Scopolamine patch - Pre-op  Airway Management Planned: Oral ETT  Additional Equipment:   Intra-op Plan:   Post-operative Plan: Extubation in OR  Informed Consent: I have reviewed the patients History and Physical, chart, labs and discussed the procedure including the risks, benefits and alternatives for the proposed anesthesia with the patient or authorized representative who has indicated his/her understanding and acceptance.   Dental advisory given  Plan Discussed with:    Anesthesia Plan Comments: (Asked to see this patient by Dr. MSabra Heck No contraindications for GA. )       Anesthesia Quick Evaluation

## 2018-11-19 ENCOUNTER — Telehealth: Payer: Self-pay | Admitting: *Deleted

## 2018-11-19 NOTE — Telephone Encounter (Signed)
Call to patient. Per ROI, can leave message on voice mail which has name confirmation.  Left message of normal blood test and that Dr Sabra Heck has reviewed with Dr Marin Olp that she should continue ASA through dose tomorrow ( Tuesday 11-20-18 dose). Left message to call back to review.

## 2018-11-19 NOTE — Telephone Encounter (Signed)
-----   Message from Megan Salon, MD sent at 11/19/2018 12:52 PM EST ----- Please let pt know her ca-125 was normal.  Also please let pt know I did talk with Dr. Marin Olp and he was in agreement with her stopping her aspirin as we discussed last week.  I will give her Lovenox the day of her surgery and then she will start back on her ASA the day after surgery.  Thanks.

## 2018-11-20 NOTE — Telephone Encounter (Signed)
Return call to patient. Left message to call back. Can speak to [facility] or triage nurse.

## 2018-11-20 NOTE — Telephone Encounter (Signed)
Patient returning call.

## 2018-11-20 NOTE — Telephone Encounter (Signed)
Call from patient. Advised of instructions from Dr Sabra Heck. Last dose of ASA to be today 11-20-18 and restart day following surgery. Advised Ca-125 normal.   Encounter closed.

## 2018-11-23 ENCOUNTER — Inpatient Hospital Stay: Payer: Managed Care, Other (non HMO) | Attending: Family

## 2018-11-23 ENCOUNTER — Telehealth: Payer: Self-pay | Admitting: Family

## 2018-11-23 ENCOUNTER — Inpatient Hospital Stay (HOSPITAL_BASED_OUTPATIENT_CLINIC_OR_DEPARTMENT_OTHER): Payer: Managed Care, Other (non HMO) | Admitting: Family

## 2018-11-23 VITALS — BP 135/91 | HR 88 | Temp 98.5°F | Resp 19 | Ht 60.0 in | Wt 156.5 lb

## 2018-11-23 DIAGNOSIS — Z86711 Personal history of pulmonary embolism: Secondary | ICD-10-CM | POA: Diagnosis not present

## 2018-11-23 DIAGNOSIS — I2601 Septic pulmonary embolism with acute cor pulmonale: Secondary | ICD-10-CM

## 2018-11-23 DIAGNOSIS — I2782 Chronic pulmonary embolism: Secondary | ICD-10-CM

## 2018-11-23 DIAGNOSIS — Z1501 Genetic susceptibility to malignant neoplasm of breast: Secondary | ICD-10-CM

## 2018-11-23 DIAGNOSIS — Z1509 Genetic susceptibility to other malignant neoplasm: Secondary | ICD-10-CM

## 2018-11-23 DIAGNOSIS — D573 Sickle-cell trait: Secondary | ICD-10-CM | POA: Insufficient documentation

## 2018-11-23 DIAGNOSIS — D509 Iron deficiency anemia, unspecified: Secondary | ICD-10-CM | POA: Insufficient documentation

## 2018-11-23 DIAGNOSIS — R591 Generalized enlarged lymph nodes: Secondary | ICD-10-CM | POA: Diagnosis not present

## 2018-11-23 DIAGNOSIS — Z7901 Long term (current) use of anticoagulants: Secondary | ICD-10-CM

## 2018-11-23 DIAGNOSIS — I2609 Other pulmonary embolism with acute cor pulmonale: Secondary | ICD-10-CM

## 2018-11-23 DIAGNOSIS — D500 Iron deficiency anemia secondary to blood loss (chronic): Secondary | ICD-10-CM

## 2018-11-23 LAB — CBC WITH DIFFERENTIAL (CANCER CENTER ONLY)
Abs Immature Granulocytes: 0.01 10*3/uL (ref 0.00–0.07)
Basophils Absolute: 0 10*3/uL (ref 0.0–0.1)
Basophils Relative: 0 %
Eosinophils Absolute: 0 10*3/uL (ref 0.0–0.5)
Eosinophils Relative: 1 %
HCT: 34.7 % — ABNORMAL LOW (ref 36.0–46.0)
Hemoglobin: 11.5 g/dL — ABNORMAL LOW (ref 12.0–15.0)
Immature Granulocytes: 0 %
LYMPHS ABS: 1.8 10*3/uL (ref 0.7–4.0)
Lymphocytes Relative: 30 %
MCH: 27.7 pg (ref 26.0–34.0)
MCHC: 33.1 g/dL (ref 30.0–36.0)
MCV: 83.6 fL (ref 80.0–100.0)
MONO ABS: 0.3 10*3/uL (ref 0.1–1.0)
MONOS PCT: 6 %
Neutro Abs: 3.7 10*3/uL (ref 1.7–7.7)
Neutrophils Relative %: 63 %
Platelet Count: 350 10*3/uL (ref 150–400)
RBC: 4.15 MIL/uL (ref 3.87–5.11)
RDW: 14.1 % (ref 11.5–15.5)
WBC Count: 5.9 10*3/uL (ref 4.0–10.5)
nRBC: 0 % (ref 0.0–0.2)

## 2018-11-23 LAB — IRON AND TIBC
Iron: 70 ug/dL (ref 41–142)
Saturation Ratios: 18 % — ABNORMAL LOW (ref 21–57)
TIBC: 389 ug/dL (ref 236–444)
UIBC: 318 ug/dL (ref 120–384)

## 2018-11-23 LAB — CMP (CANCER CENTER ONLY)
ALT: 10 U/L (ref 0–44)
AST: 17 U/L (ref 15–41)
Albumin: 4.4 g/dL (ref 3.5–5.0)
Alkaline Phosphatase: 48 U/L (ref 38–126)
Anion gap: 6 (ref 5–15)
BUN: 7 mg/dL (ref 6–20)
CO2: 27 mmol/L (ref 22–32)
Calcium: 9 mg/dL (ref 8.9–10.3)
Chloride: 102 mmol/L (ref 98–111)
Creatinine: 0.87 mg/dL (ref 0.44–1.00)
GFR, Est AFR Am: >60 mL/min (ref >60)
GFR, Estimated: >60 mL/min (ref >60)
Glucose, Bld: 115 mg/dL — ABNORMAL HIGH (ref 70–99)
Potassium: 3.7 mmol/L (ref 3.5–5.1)
Sodium: 135 mmol/L (ref 135–145)
Total Bilirubin: 0.6 mg/dL (ref 0.3–1.2)
Total Protein: 7.1 g/dL (ref 6.5–8.1)

## 2018-11-23 LAB — FERRITIN: Ferritin: 11 ng/mL (ref 11–307)

## 2018-11-23 NOTE — Progress Notes (Signed)
Hematology and Oncology Follow Up Visit  Claudia Rivas [id number] [birth year] 47 y.o. 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 She is doing well but does feel fatigued after working last night. She has a mandatory work Christmas party to go to today and then has to work again [employer].  She has fibroids with heavy vaginal bleeding. She is scheduled to have her ovaries removed next week with Dr. Sabra Heck.  She has stopped her baby aspirin and will restarted the day after surgery. I checked with Dr. Marin Olp and he agrees with this.  She has had no other bleeding, no bruising or petechiae.  No fever, chills, n/v, cough, rash, dizziness, SOB, chest pain, palpitations, abdominal pain or changes in bowel or bladder habits.  No swelling, tenderness, numbness or tingling in her extremities at this time.  No lymphadenopathy noted on exam.  She has maintained a good appetite and is staying well hydrated. Her weight is stable.   ECOG Performance Status: 1 - Symptomatic but completely ambulatory  Medications:  Allergies as of 11/23/2018      Reactions   Latex Rash      Medication List       Accurate as of November 23, 2018  9:03 AM. Always use your most recent med list.        acetaminophen 325 MG tablet Commonly known as:  TYLENOL Take 650 mg by mouth every 6 (six) hours as needed for mild pain.   aspirin EC 81 MG tablet Take 2 tablets (162 mg total) by mouth daily.   cholecalciferol 1000 units tablet Commonly known as:  VITAMIN D Take 1,000 Units by mouth daily.   folic acid 1 MG tablet Commonly known as:  FOLVITE Take 1 tablet (1 mg total)  by mouth daily.   norethindrone 5 MG tablet Commonly known as:  AYGESTIN Take 5 mg by mouth daily.       Allergies:  Allergies  Allergen Reactions  . Latex Rash    Past Medical History, Surgical history, Social history, and Family History were reviewed and updated.  Review of Systems: All other 10 point review of systems is negative.   Physical Exam:  vitals were not taken for this visit.   Wt Readings from Last 3 Encounters:  11/16/18 157 lb 6 oz (71.4 kg)  11/15/18 158 lb (71.7 kg)  08/29/18 160 lb (72.6 kg)    Ocular: Sclerae unicteric, pupils equal, round and reactive to light Ear-nose-throat: Oropharynx clear, dentition fair Lymphatic: No cervical, supraclavicular or axillary adenopathy Lungs no rales or rhonchi, good excursion bilaterally Heart regular rate and rhythm, no murmur appreciated Abd soft, nontender, positive bowel sounds, no liver or spleen tip palpated on exam, no fluid wave  MSK no focal spinal tenderness, no joint edema Neuro: non-focal, well-oriented, appropriate affect Breasts: Deferred   Lab Results  Component Value Date   WBC 5.9 11/23/2018   HGB 11.5 (L) 11/23/2018   HCT 34.7 (L) 11/23/2018   MCV 83.6 11/23/2018   PLT 350 11/23/2018   Lab Results  Component Value Date   FERRITIN 42 08/29/2018   IRON 60 08/29/2018   TIBC  329 08/29/2018   UIBC 268 08/29/2018   IRONPCTSAT 18 (L) 08/29/2018   Lab Results  Component Value Date   RETICCTPCT 2.1 06/20/2018   RBC 4.15 11/23/2018   No results found for: KPAFRELGTCHN, LAMBDASER, KAPLAMBRATIO No results found for: Kandis Cocking, IGMSERUM No results found for: Odetta Pink, SPEI   Chemistry      Component Value Date/Time   NA 140 11/16/2018 0952   NA 143 12/11/2017 1011   K 3.4 (L) 11/16/2018 0952   K 4.0 12/11/2017 1011   CL 110 11/16/2018 0952   CL 111 (H) 10/11/2017 0847   CO2 23 11/16/2018 0952   CO2 25 12/11/2017 1011    BUN 8 11/16/2018 0952   BUN 11.2 12/11/2017 1011   CREATININE 0.87 11/16/2018 0952   CREATININE 1.00 08/29/2018 0918   CREATININE 0.9 12/11/2017 1011      Component Value Date/Time   CALCIUM 8.5 (L) 11/16/2018 0952   CALCIUM 9.4 12/11/2017 1011   ALKPHOS 55 08/29/2018 0918   ALKPHOS 70 12/11/2017 1011   AST 22 08/29/2018 0918   AST 15 12/11/2017 1011   ALT 20 08/29/2018 0918   ALT 14 12/11/2017 1011   BILITOT 0.5 08/29/2018 0918   BILITOT 0.43 12/11/2017 1011       Impression and Plan: Claudia Rivas is a very pleasant 47 yo African American female with history of PE, sickle cell trait, iron deficiency anemia and BRCA1 (+).  She will be having her ovaries removed next week and will restart her aspirin the day after surgery.  We will see what her iron studies show and bring her back in for infusion if needed.  We will plan to see her back in another 3-4 months for follow-up.  She will contact our office with any questions or concerns. We can certainly see her sooner if need be.   Laverna Peace, NP 12/13/20199:03 AM

## 2018-11-23 NOTE — Telephone Encounter (Signed)
Appointments scheduled per 12/13 los °

## 2018-11-26 ENCOUNTER — Ambulatory Visit (HOSPITAL_COMMUNITY)
Admission: RE | Admit: 2018-11-26 | Discharge: 2018-11-26 | Disposition: A | Discharge status: Home / Self Care | Payer: Managed Care, Other (non HMO) | Source: Ambulatory Visit | Attending: Obstetrics & Gynecology | Admitting: Obstetrics & Gynecology

## 2018-11-26 ENCOUNTER — Other Ambulatory Visit: Payer: Self-pay

## 2018-11-26 ENCOUNTER — Ambulatory Visit (HOSPITAL_COMMUNITY): Payer: Managed Care, Other (non HMO) | Admitting: Anesthesiology

## 2018-11-26 ENCOUNTER — Encounter (HOSPITAL_COMMUNITY)
Admission: RE | Disposition: A | Discharge status: Home / Self Care | Payer: Self-pay | Source: Ambulatory Visit | Attending: Obstetrics & Gynecology

## 2018-11-26 ENCOUNTER — Encounter (HOSPITAL_COMMUNITY): Payer: Self-pay

## 2018-11-26 DIAGNOSIS — Z7982 Long term (current) use of aspirin: Secondary | ICD-10-CM | POA: Insufficient documentation

## 2018-11-26 DIAGNOSIS — Z1501 Genetic susceptibility to malignant neoplasm of breast: Secondary | ICD-10-CM | POA: Insufficient documentation

## 2018-11-26 DIAGNOSIS — D250 Submucous leiomyoma of uterus: Secondary | ICD-10-CM | POA: Insufficient documentation

## 2018-11-26 DIAGNOSIS — Z79899 Other long term (current) drug therapy: Secondary | ICD-10-CM | POA: Insufficient documentation

## 2018-11-26 DIAGNOSIS — N8302 Follicular cyst of left ovary: Secondary | ICD-10-CM | POA: Insufficient documentation

## 2018-11-26 DIAGNOSIS — N8301 Follicular cyst of right ovary: Secondary | ICD-10-CM | POA: Insufficient documentation

## 2018-11-26 DIAGNOSIS — Z86711 Personal history of pulmonary embolism: Secondary | ICD-10-CM | POA: Diagnosis not present

## 2018-11-26 DIAGNOSIS — D259 Leiomyoma of uterus, unspecified: Secondary | ICD-10-CM

## 2018-11-26 DIAGNOSIS — N858 Other specified noninflammatory disorders of uterus: Secondary | ICD-10-CM

## 2018-11-26 DIAGNOSIS — Z91040 Latex allergy status: Secondary | ICD-10-CM | POA: Insufficient documentation

## 2018-11-26 DIAGNOSIS — Z793 Long term (current) use of hormonal contraceptives: Secondary | ICD-10-CM | POA: Diagnosis not present

## 2018-11-26 HISTORY — PX: LAPAROSCOPIC BILATERAL SALPINGECTOMY: SHX5889

## 2018-11-26 HISTORY — PX: DILATATION & CURETTAGE/HYSTEROSCOPY WITH MYOSURE: SHX6511

## 2018-11-26 LAB — PREGNANCY, URINE: Preg Test, Ur: NEGATIVE

## 2018-11-26 SURGERY — SALPINGECTOMY, BILATERAL, LAPAROSCOPIC
Anesthesia: General | Site: Vagina

## 2018-11-26 MED ORDER — CEFAZOLIN SODIUM-DEXTROSE 2-4 GM/100ML-% IV SOLN
INTRAVENOUS | Status: AC
  Filled 2018-11-26: qty 100

## 2018-11-26 MED ORDER — PROPOFOL 10 MG/ML IV BOLUS
INTRAVENOUS | Status: AC
  Filled 2018-11-26: qty 20

## 2018-11-26 MED ORDER — LIDOCAINE HCL (CARDIAC) PF 100 MG/5ML IV SOSY
PREFILLED_SYRINGE | INTRAVENOUS | Status: DC | PRN
  Administered 2018-11-26: 80 mg via INTRAVENOUS

## 2018-11-26 MED ORDER — CEFAZOLIN SODIUM-DEXTROSE 2-3 GM-%(50ML) IV SOLR
INTRAVENOUS | Status: DC | PRN
  Administered 2018-11-26: 2 g via INTRAVENOUS

## 2018-11-26 MED ORDER — DEXAMETHASONE SODIUM PHOSPHATE 10 MG/ML IJ SOLN
INTRAMUSCULAR | Status: DC | PRN
  Administered 2018-11-26: 10 mg via INTRAVENOUS

## 2018-11-26 MED ORDER — ONDANSETRON HCL 4 MG/2ML IJ SOLN
INTRAMUSCULAR | Status: DC | PRN
  Administered 2018-11-26: 4 mg via INTRAVENOUS

## 2018-11-26 MED ORDER — OXYCODONE HCL 5 MG/5ML PO SOLN: 5.0000 mg | Freq: Once | ORAL | Status: AC | PRN

## 2018-11-26 MED ORDER — DEXAMETHASONE SODIUM PHOSPHATE 10 MG/ML IJ SOLN
INTRAMUSCULAR | Status: AC
  Filled 2018-11-26: qty 1

## 2018-11-26 MED ORDER — LACTATED RINGERS IV SOLN
INTRAVENOUS | Status: DC
  Administered 2018-11-26: 08:00:00 via INTRAVENOUS
  Administered 2018-11-26: 125 mL/h via INTRAVENOUS

## 2018-11-26 MED ORDER — ONDANSETRON HCL 4 MG/2ML IJ SOLN
INTRAMUSCULAR | Status: AC
  Filled 2018-11-26: qty 2

## 2018-11-26 MED ORDER — SUGAMMADEX SODIUM 200 MG/2ML IV SOLN
INTRAVENOUS | Status: AC
  Filled 2018-11-26: qty 2

## 2018-11-26 MED ORDER — LACTATED RINGERS IV SOLN: INTRAVENOUS | Status: DC

## 2018-11-26 MED ORDER — SCOPOLAMINE 1 MG/3DAYS TD PT72
MEDICATED_PATCH | TRANSDERMAL | Status: AC
  Administered 2018-11-26: 1.5 mg via TRANSDERMAL
  Filled 2018-11-26: qty 1

## 2018-11-26 MED ORDER — BUPIVACAINE HCL (PF) 0.25 % IJ SOLN
INTRAMUSCULAR | Status: AC
  Filled 2018-11-26: qty 30

## 2018-11-26 MED ORDER — LIDOCAINE HCL 1 % IJ SOLN
INTRAMUSCULAR | Status: AC
  Filled 2018-11-26: qty 20

## 2018-11-26 MED ORDER — ENOXAPARIN SODIUM 40 MG/0.4ML ~~LOC~~ SOLN
40.0000 mg | SUBCUTANEOUS | Status: AC
  Administered 2018-11-26: 40 mg via SUBCUTANEOUS

## 2018-11-26 MED ORDER — SODIUM CHLORIDE 0.9 % IR SOLN
Status: DC | PRN
  Administered 2018-11-26: 3000 mL

## 2018-11-26 MED ORDER — FENTANYL CITRATE (PF) 100 MCG/2ML IJ SOLN
INTRAMUSCULAR | Status: DC | PRN
  Administered 2018-11-26: 100 ug via INTRAVENOUS

## 2018-11-26 MED ORDER — PHENYLEPHRINE 40 MCG/ML (10ML) SYRINGE FOR IV PUSH (FOR BLOOD PRESSURE SUPPORT)
PREFILLED_SYRINGE | INTRAVENOUS | Status: DC | PRN
  Administered 2018-11-26: 80 ug via INTRAVENOUS

## 2018-11-26 MED ORDER — SODIUM CHLORIDE (PF) 0.9 % IJ SOLN
INTRAMUSCULAR | Status: DC | PRN
  Administered 2018-11-26: 10 mL

## 2018-11-26 MED ORDER — MIDAZOLAM HCL 2 MG/2ML IJ SOLN
INTRAMUSCULAR | Status: DC | PRN
  Administered 2018-11-26: 2 mg via INTRAVENOUS

## 2018-11-26 MED ORDER — MIDAZOLAM HCL 2 MG/2ML IJ SOLN
INTRAMUSCULAR | Status: AC
  Filled 2018-11-26: qty 2

## 2018-11-26 MED ORDER — PHENYLEPHRINE 40 MCG/ML (10ML) SYRINGE FOR IV PUSH (FOR BLOOD PRESSURE SUPPORT)
PREFILLED_SYRINGE | INTRAVENOUS | Status: AC
  Filled 2018-11-26: qty 10

## 2018-11-26 MED ORDER — HYDROCODONE-ACETAMINOPHEN 5-325 MG PO TABS: 1.0000 | ORAL_TABLET | Freq: Four times a day (QID) | ORAL | 0 refills | Status: DC | PRN

## 2018-11-26 MED ORDER — BUPIVACAINE HCL (PF) 0.25 % IJ SOLN
INTRAMUSCULAR | Status: DC | PRN
  Administered 2018-11-26: 10 mL

## 2018-11-26 MED ORDER — KETOROLAC TROMETHAMINE 30 MG/ML IJ SOLN
INTRAMUSCULAR | Status: DC | PRN
  Administered 2018-11-26: 30 mg via INTRAVENOUS

## 2018-11-26 MED ORDER — LIDOCAINE-EPINEPHRINE 1 %-1:100000 IJ SOLN
INTRAMUSCULAR | Status: AC
  Filled 2018-11-26: qty 1

## 2018-11-26 MED ORDER — ROCURONIUM BROMIDE 100 MG/10ML IV SOLN
INTRAVENOUS | Status: DC | PRN
  Administered 2018-11-26: 40 mg via INTRAVENOUS
  Administered 2018-11-26: 10 mg via INTRAVENOUS

## 2018-11-26 MED ORDER — METOCLOPRAMIDE HCL 5 MG/ML IJ SOLN: 10.0000 mg | Freq: Once | INTRAMUSCULAR | Status: DC | PRN

## 2018-11-26 MED ORDER — LIDOCAINE HCL (CARDIAC) PF 100 MG/5ML IV SOSY
PREFILLED_SYRINGE | INTRAVENOUS | Status: AC
  Filled 2018-11-26: qty 5

## 2018-11-26 MED ORDER — FENTANYL CITRATE (PF) 250 MCG/5ML IJ SOLN
INTRAMUSCULAR | Status: AC
  Filled 2018-11-26: qty 5

## 2018-11-26 MED ORDER — MEPERIDINE HCL 25 MG/ML IJ SOLN: 6.2500 mg | INTRAMUSCULAR | Status: DC | PRN

## 2018-11-26 MED ORDER — IBUPROFEN 800 MG PO TABS: 800.0000 mg | ORAL_TABLET | Freq: Three times a day (TID) | ORAL | 0 refills | Status: DC | PRN

## 2018-11-26 MED ORDER — ROCURONIUM BROMIDE 100 MG/10ML IV SOLN
INTRAVENOUS | Status: AC
  Filled 2018-11-26: qty 1

## 2018-11-26 MED ORDER — OXYCODONE HCL 5 MG PO TABS
5.0000 mg | ORAL_TABLET | Freq: Once | ORAL | Status: AC | PRN
  Administered 2018-11-26: 5 mg via ORAL

## 2018-11-26 MED ORDER — OXYCODONE HCL 5 MG PO TABS
ORAL_TABLET | ORAL | Status: AC
  Filled 2018-11-26: qty 1

## 2018-11-26 MED ORDER — KETOROLAC TROMETHAMINE 30 MG/ML IJ SOLN
INTRAMUSCULAR | Status: AC
  Filled 2018-11-26: qty 1

## 2018-11-26 MED ORDER — SODIUM CHLORIDE (PF) 0.9 % IJ SOLN
INTRAMUSCULAR | Status: AC
  Filled 2018-11-26: qty 10

## 2018-11-26 MED ORDER — SCOPOLAMINE 1 MG/3DAYS TD PT72
1.0000 | MEDICATED_PATCH | Freq: Once | TRANSDERMAL | Status: DC
  Administered 2018-11-26: 1.5 mg via TRANSDERMAL

## 2018-11-26 MED ORDER — SUGAMMADEX SODIUM 200 MG/2ML IV SOLN
INTRAVENOUS | Status: DC | PRN
  Administered 2018-11-26: 200 mg via INTRAVENOUS

## 2018-11-26 MED ORDER — LIDOCAINE-EPINEPHRINE 1 %-1:100000 IJ SOLN
INTRAMUSCULAR | Status: DC | PRN
  Administered 2018-11-26: 10 mL

## 2018-11-26 MED ORDER — ENOXAPARIN SODIUM 40 MG/0.4ML ~~LOC~~ SOLN
SUBCUTANEOUS | Status: AC
  Administered 2018-11-26: 40 mg via SUBCUTANEOUS
  Filled 2018-11-26: qty 0.4

## 2018-11-26 MED ORDER — PROPOFOL 10 MG/ML IV BOLUS
INTRAVENOUS | Status: DC | PRN
  Administered 2018-11-26: 150 mg via INTRAVENOUS

## 2018-11-26 MED ORDER — FENTANYL CITRATE (PF) 100 MCG/2ML IJ SOLN: 25.0000 ug | INTRAMUSCULAR | Status: DC | PRN

## 2018-11-26 SURGICAL SUPPLY — 45 items
APPLICATOR ARISTA FLEXITIP XL (MISCELLANEOUS) IMPLANT
BENZOIN TINCTURE PRP APPL 2/3 (GAUZE/BANDAGES/DRESSINGS) IMPLANT
CABLE HIGH FREQUENCY MONO STRZ (ELECTRODE) ×4 IMPLANT
CATH ROBINSON RED A/P 16FR (CATHETERS) ×4 IMPLANT
CLOSURE WOUND 1/2 X4 (GAUZE/BANDAGES/DRESSINGS)
DERMABOND ADVANCED (GAUZE/BANDAGES/DRESSINGS) ×2
DERMABOND ADVANCED .7 DNX12 (GAUZE/BANDAGES/DRESSINGS) ×2 IMPLANT
DEVICE MYOSURE LITE (MISCELLANEOUS) IMPLANT
DEVICE MYOSURE REACH (MISCELLANEOUS) IMPLANT
DILATOR CANAL MILEX (MISCELLANEOUS) ×4 IMPLANT
DRSG OPSITE POSTOP 3X4 (GAUZE/BANDAGES/DRESSINGS) ×4 IMPLANT
DURAPREP 26ML APPLICATOR (WOUND CARE) ×4 IMPLANT
GLOVE BIOGEL PI IND STRL 7.0 (GLOVE) ×8 IMPLANT
GLOVE BIOGEL PI INDICATOR 7.0 (GLOVE) ×8
GLOVE ECLIPSE 6.5 STRL STRAW (GLOVE) ×4 IMPLANT
GOWN STRL REUS W/TWL LRG LVL3 (GOWN DISPOSABLE) ×12 IMPLANT
HEMOSTAT ARISTA ABSORB 3G PWDR (MISCELLANEOUS) IMPLANT
HIBICLENS CHG 4% 4OZ BTL (MISCELLANEOUS) ×4 IMPLANT
KIT PROCEDURE FLUENT (KITS) ×4 IMPLANT
LIGASURE VESSEL 5MM BLUNT TIP (ELECTROSURGICAL) ×4 IMPLANT
NEEDLE INSUFFLATION 120MM (ENDOMECHANICALS) ×4 IMPLANT
PACK LAPAROSCOPY BASIN (CUSTOM PROCEDURE TRAY) ×4 IMPLANT
PACK TRENDGUARD 450 HYBRID PRO (MISCELLANEOUS) ×2 IMPLANT
PACK VAGINAL MINOR WOMEN LF (CUSTOM PROCEDURE TRAY) ×4 IMPLANT
PAD OB MATERNITY 4.3X12.25 (PERSONAL CARE ITEMS) ×4 IMPLANT
POUCH LAPAROSCOPIC INSTRUMENT (MISCELLANEOUS) ×4 IMPLANT
POUCH SPECIMEN RETRIEVAL 10MM (ENDOMECHANICALS) ×4 IMPLANT
PROTECTOR NERVE ULNAR (MISCELLANEOUS) ×8 IMPLANT
SEAL ROD LENS SCOPE MYOSURE (ABLATOR) ×4 IMPLANT
SET IRRIG TUBING LAPAROSCOPIC (IRRIGATION / IRRIGATOR) IMPLANT
SHEARS HARMONIC ACE PLUS 36CM (ENDOMECHANICALS) IMPLANT
SLEEVE ADV FIXATION 5X100MM (TROCAR) ×4 IMPLANT
STRIP CLOSURE SKIN 1/2X4 (GAUZE/BANDAGES/DRESSINGS) IMPLANT
SUT VICRYL 0 UR6 27IN ABS (SUTURE) ×4 IMPLANT
SUT VICRYL 4-0 PS2 18IN ABS (SUTURE) ×4 IMPLANT
SYR 30ML LL (SYRINGE) IMPLANT
SYSTEM CARTER THOMASON II (TROCAR) ×4 IMPLANT
TOWEL OR 17X24 6PK STRL BLUE (TOWEL DISPOSABLE) ×8 IMPLANT
TRAY FOLEY W/BAG SLVR 14FR (SET/KITS/TRAYS/PACK) ×4 IMPLANT
TRENDGUARD 450 HYBRID PRO PACK (MISCELLANEOUS) ×4
TROCAR ADV FIXATION 5X100MM (TROCAR) ×4 IMPLANT
TROCAR XCEL NON-BLD 11X100MML (ENDOMECHANICALS) ×4 IMPLANT
TROCAR XCEL NON-BLD 5MMX100MML (ENDOMECHANICALS) ×4 IMPLANT
TUBING INSUF HEATED (TUBING) ×4 IMPLANT
WARMER LAPAROSCOPE (MISCELLANEOUS) ×4 IMPLANT

## 2018-11-26 NOTE — Op Note (Signed)
11/26/2018  9:28 AM  PATIENT:  Claudia Rivas  47 y.o. female  PRE-OPERATIVE DIAGNOSIS:  BRCA 1 positive, fibroid uterus, endometrial mass, h/o PE 8/18  POST-OPERATIVE DIAGNOSIS:  BRCA 1 positive, fibroid uterus, endometrial mass, h/o PE 8/18  PROCEDURE:  Procedure(s): LAPAROSCOPIC BILATERAL SALPINGECTOMY DILATATION & CURETTAGE/HYSTEROSCOPY WITH MYOSURE  SURGEON:  Megan Salon  ASSISTANTS: Sumner Boast, MD   ANESTHESIA:   General  ESTIMATED BLOOD LOSS: 25 mL  BLOOD ADMINISTERED: none  FLUIDS: 1400cc LR  UOP: 300cc clear UOP  SPECIMEN:  Bilateral tubes and ovaries  DISPOSITION OF SPECIMEN:  PATHOLOGY  FINDINGS: enlarged, fibroid uterus, submucosal fibroids  DESCRIPTION OF OPERATION: Patient is taken to the operating room. She is placed in the supine position. She is a running IV in place. Informed consent was present on the chart. SCDs on her lower extremities and functioning properly. Patient was positioned while she was awake.  Her legs were placed in the low lithotomy position in [place]. Her arms were tucked by the side.  General endotracheal anesthesia was administered by the anesthesia staff without difficulty. Dr. Marcell Barlow, anesthesia, oversaw case.  Time out performed.    Chlora prep was then used to prep the abdomen and Betadine was used to prep the inner thighs, perineum and vagina. Once 3 minutes had past the patient was draped in a normal standard fashion. The legs were lifted to the high lithotomy position. The cervix was visualized by placing a bivalved Graves speculum in the vagina.  The anterior lip of the cervix was grasped with a single toothed tenaculum.  A Hulka clamp was passed through the cervical os and attached to the anterior lip of the cervix for uterine manipulation.  The tenaculum was removed.  The speculum was removed.  A Foley catheter was placed to straight drain.  Clear urine was noted. Legs were lowered to the low lithotomy position and  attention was turned the abdomen.  The umbilicus was everted.  A Veress needle was obtained. Syringe of sterile saline was placed on a open Veress needle.  This was passed into the umbilicus until just when the fluid started to drip.  Then low flow CO2 gas was attached the needle and the pneumoperitoneum was achieved without difficulty. Once 3.4 liters of gas was in the abdomen the Veress needle was removed and a 5 millimeter non-bladed Optiview trocar and port were passed directly to the abdomen. The laparoscope was then used to confirm intraperitoneal placement. A fibroid uterus with fundal fibroid was noted. Ovaries were normal.  Upper abdomen was normal.  Locations for RLQ and LLQ ports were noted by transillumination of the abdominal wall.  0.25% marcaine was used to anesthetize the skin.  46m skin incision was made in the RLQ and a 543mwas placed underdirect visualization of the laparoscope.  Then a 1045mkin incision was made and a 56m65mnbladed trochar and port was placed in the LLQ.  All trochars were removed.    Ureters were identifies.  Attention was turned to the left side. With uterus on stretch the left IP ligament was serially clamped, cauterized and incised.  The mesosalpinx was then serially clamped, cauterized and incised.  Lastly the uteroovarian pedicle was clamped, cauterized and incised, keeping very close to the uterus to removal the entire tube.  Excellent hemostasis was noted.    Attention was turned the right side.  With uterus on stretch the right IP ligament was serially clamped, cauterized and incised.  The mesosalpinx was  then serially clamped, cauterized and incised.  Lastly the uteroovarian pedicle was clamped, cauterized and incised, keeping very close to the uterus to removal the entire tube.  Excellent hemostasis was noted on this side as well.  The specimens were placed in a laparoscopic surgical bag and brought out the LLQ incision after the port was removed.  The  ovaries did require being cut to remove through the incision but the bag remained fully intact.  After the ovaries were removed, this portion of the procedure was complete.  The pelvis was irrigated.  Then the CO2 pressured were lowered to 93m Hg and all pedicles were inspected.  No bleeding was noted.  The LLQ largest port was closed with a fascial closure device after the port was removed.  Suture was tied and excellent closure of the fascia was noted.  The remaining instruments were removed.  The pneumoperitoneum was relieved.  The patient was taken out of Trendelenburg positioning.  Several deep breaths were given to the patient's trying to any gas the abdomen and finally the final two ports were removed.  The skin was then closed with subcuticular stitches of 3-0 Vicryl. The skin was cleansed Dermabond was applied.   Attention was then turned the vagina.  The hulka clamp was removed.  The legs were lifted to the high lithotomy position.  The anterior lip of the cervix was grasped with single-tooth tenaculum.  A paracervical block of 1% lidocaine mixed one-to-one with epinephrine (1:100,000 units) was placed.  10 cc was used total. The cervix is dilated up to [facility]. The endometrial cavity sounded to 8 cm.   A myosure hysteroscope was obtained. Normal saline was used as a hysteroscopic fluid. The hysteroscope was advanced through the endocervical canal into the endometrial cavity. The tubal ostia were noted bilaterally. There were two submucosal fibroids present but no polyp.  The endometrium was thin.  No additional procedure was performed as pt's bleeding has been appropriate given her fibroid uterus and there was no endometrial polyp.  The hysteroscope was removed.  The fluid deficit was 100 cc. The tenaculum was removed from the anterior lip of the cervix. The speculum was removed from the vagina. The prep was cleansed of the patient's skin. The legs are positioned back in the supine  position. Sponge, lap, needle, initially counts were correct x2. Patient was taken to recovery in stable condition.  COUNTS:  YES  PLAN OF CARE: Transfer to PACU

## 2018-11-26 NOTE — Transfer of Care (Signed)
Immediate Anesthesia Transfer of Care Note  Patient: Claudia Rivas  Procedure(s) Performed: LAPAROSCOPIC BILATERAL SALPINGECTOMY (Bilateral Abdomen) DILATATION & CURETTAGE/HYSTEROSCOPY WITH MYOSURE (N/A Vagina )  Patient Location: PACU  Anesthesia Type:General  Level of Consciousness: sedated  Airway & Oxygen Therapy: Patient Spontanous Breathing and Patient connected to nasal cannula oxygen  Post-op Assessment: Report given to RN  Post vital signs: Reviewed and stable  Last Vitals:  Vitals Value Taken Time  BP    Temp    Pulse    Resp    SpO2      Last Pain:  Vitals:   11/26/18 0630  TempSrc: Oral  PainSc: 0-No pain      Patients Stated Pain Goal: 3 (13/24/40 1027)  Complications: No apparent anesthesia complications

## 2018-11-26 NOTE — Discharge Instructions (Signed)

## 2018-11-26 NOTE — H&P (Signed)
Claudia Rivas is an 47 y.o. female G3P3 Married AAF here for laparoscopic BSO and hysteroscopy with polyp resection.  Pt has BRCA 1 positive testing.  She also has fibroids but has a hx of PE 8/18 so she and I have discussed not proceeding with hysterectomy.  Risks, benefits and alternatives reviewed.  Questions answered.  Pertinent Gynecological History: Menses: irregular Bleeding: dysfunctional uterine bleeding Contraception: none DES exposure: denies Blood transfusions: none Sexually transmitted diseases: no past history Previous GYN Procedures: none  Last mammogram: normal Date: 2/19 Last pap: normal Date: 5/19 OB History: G3, P3   Menstrual History: Patient's last menstrual period was 11/07/2018 (exact date).    Past Medical History:  Diagnosis Date  . BRCA1 gene mutation positive   . Diverticulitis   . Family history of BRCA1 gene positive   . Family history of breast cancer   . Fibroids   . GERD (gastroesophageal reflux disease)    diet controlled - no meds  . Iron deficiency anemia    received iron transfusion 08/17/2016   . Pneumonia 08/2009   one time only 2010  . Pulmonary embolism ([place]) 08/16/2016  . Sickle cell trait ([place])   . SVD (spontaneous vaginal delivery)    x 3    Past Surgical History:  Procedure Laterality Date  . BREAST BIOPSY Right   . COLONOSCOPY     polyps  . WISDOM TOOTH EXTRACTION  2001    Family History  Problem Relation Age of Onset  . Crohn's disease Mother   . Breast cancer Mother 28       recurrance at 8, died at 70  . BRCA 1/2 Mother   . Crohn's disease Sister   . Diverticulitis Brother   . Breast cancer Daughter 74  . BRCA 1/2 Daughter        BRCA1 [id number] (Splice donor)  . Breast cancer Maternal Aunt 44  . BRCA 1/2 Maternal Aunt   . Breast cancer Maternal Aunt 41       dx stage IV, lived 3 more weeks  . Heart attack Maternal Grandmother   . Cervical cancer Sister 68       pat 1/2 sister, died at 32     Social History:  reports that she has never smoked. She has never used smokeless tobacco. She reports current alcohol use. She reports that she does not use drugs.  Allergies:  Allergies  Allergen Reactions  . Latex Rash    Medications Prior to Admission  Medication Sig Dispense Refill Last Dose  . acetaminophen (TYLENOL) 325 MG tablet Take 650 mg by mouth every 6 (six) hours as needed for mild pain.   Past Week at Unknown time  . aspirin EC 81 MG tablet Take 2 tablets (162 mg total) by mouth daily. 180 tablet 6 Past Week at Unknown time  . folic acid (FOLVITE) 1 MG tablet Take 1 tablet (1 mg total) by mouth daily. 90 tablet 3 Past Month at Unknown time  . norethindrone (AYGESTIN) 5 MG tablet Take 5 mg by mouth daily.   Past Week at Unknown time  . cholecalciferol (VITAMIN D) 1000 units tablet Take 1,000 Units by mouth daily.   More than a month at Unknown time    Review of Systems  All other systems reviewed and are negative.   Blood pressure 118/86, pulse 79, temperature 98.2 F (36.8 C), temperature source Oral, resp. rate 18, last menstrual period 11/07/2018, SpO2 100 %. Physical Exam  Constitutional: She is  oriented to person, place, and time. She appears well-developed and well-nourished.  Cardiovascular: Normal rate and regular rhythm.  Respiratory: Effort normal and breath sounds normal.  Neurological: She is alert and oriented to person, place, and time.  Skin: Skin is warm and dry.  Psychiatric: She has a normal mood and affect.    Results for orders placed or performed during the hospital encounter of 11/26/18 (from the past 24 hour(s))  Pregnancy, urine     Status: None   Collection Time: 11/26/18  6:00 AM  Result Value Ref Range   Preg Test, Ur NEGATIVE NEGATIVE    No results found.  Assessment/Plan: 47 yo G3P3 with BRCA 1 testing and endometrial polyp here for laparoscopic BSO and hysteroscopic with polyp resection, D&C.  Questions answered.  Pt ready to  proceed.  Megan Salon 11/26/2018, 7:18 AM

## 2018-11-26 NOTE — Anesthesia Procedure Notes (Signed)
Procedure Name: Intubation Date/Time: 11/26/2018 7:48 AM Performed by: Asher Muir, CRNA Pre-anesthesia Checklist: Patient identified, Emergency Drugs available, Suction available and Patient being monitored Patient Re-evaluated:Patient Re-evaluated prior to induction Oxygen Delivery Method: Circle system utilized and Simple face mask Preoxygenation: Pre-oxygenation with 100% oxygen Induction Type: IV induction Ventilation: Mask ventilation without difficulty Laryngoscope Size: Mac and 3 (disposable) Grade View: Grade II Tube type: Oral Tube size: 7.0 mm Number of attempts: 1 Airway Equipment and Method: Stylet Placement Confirmation: ETT inserted through vocal cords under direct vision,  positive ETCO2 and breath sounds checked- equal and bilateral Secured at: 20 (right lip) cm Tube secured with: Tape Dental Injury: Teeth and Oropharynx as per pre-operative assessment

## 2018-11-27 NOTE — Anesthesia Postprocedure Evaluation (Signed)
Anesthesia Post Note  Patient: Claudia Rivas  Procedure(s) Performed: LAPAROSCOPIC BILATERAL SALPINGECTOMY OOPHORECTOMY (Bilateral Abdomen) DILATATION & CURETTAGE/HYSTEROSCOPY (N/A Vagina )     Patient location during evaluation: PACU Anesthesia Type: General Level of consciousness: awake and alert Pain management: pain level controlled Vital Signs Assessment: post-procedure vital signs reviewed and stable Respiratory status: spontaneous breathing, nonlabored ventilation, respiratory function stable and patient connected to nasal cannula oxygen Cardiovascular status: blood pressure returned to baseline and stable Postop Assessment: no apparent nausea or vomiting Anesthetic complications: no    Last Vitals:  Vitals:   11/26/18 1100 11/26/18 1236  BP: 113/79 114/70  Pulse: 89 80  Resp: (!) 24 18  Temp:  36.7 C  SpO2: 99% 99%    Last Pain:  Vitals:   11/26/18 1250  TempSrc:   PainSc: 3                  Montez Hageman

## 2018-11-28 ENCOUNTER — Encounter (HOSPITAL_COMMUNITY): Payer: Self-pay | Admitting: Obstetrics & Gynecology

## 2018-11-29 ENCOUNTER — Telehealth: Payer: Self-pay | Admitting: Family

## 2018-11-29 NOTE — Telephone Encounter (Signed)
Spoke with pt to confirm iron infusion appt 12/04/18 at 0800 per sch msg

## 2018-11-30 ENCOUNTER — Telehealth: Payer: Self-pay | Admitting: *Deleted

## 2018-11-30 NOTE — Telephone Encounter (Signed)
-----   Message from Megan Salon, MD sent at 11/30/2018 12:17 PM EST ----- Please let pt know her ovaries and tubes were normal.  Pathology showed no abnormal cells.  Can you get update on how she is doing?  Thanks.

## 2018-11-30 NOTE — Telephone Encounter (Signed)
Notes recorded by Burnice Logan, RN on 11/30/2018 at 1:30 PM EST Left detailed message, ok per dpr, name identified on voicemail. Advised as seen below per Dr. Sabra Heck. Requested return call to office at [phone] to provide update.   See telephone encounter dated 11/30/18

## 2018-12-04 ENCOUNTER — Other Ambulatory Visit: Payer: Self-pay

## 2018-12-04 ENCOUNTER — Inpatient Hospital Stay: Payer: Managed Care, Other (non HMO)

## 2018-12-04 VITALS — BP 122/76 | HR 80 | Temp 98.0°F | Resp 18

## 2018-12-04 DIAGNOSIS — Z86711 Personal history of pulmonary embolism: Secondary | ICD-10-CM | POA: Diagnosis not present

## 2018-12-04 DIAGNOSIS — D500 Iron deficiency anemia secondary to blood loss (chronic): Secondary | ICD-10-CM

## 2018-12-04 MED ORDER — SODIUM CHLORIDE 0.9 % IV SOLN
510.0000 mg | Freq: Once | INTRAVENOUS | Status: AC
  Administered 2018-12-04: 510 mg via INTRAVENOUS
  Filled 2018-12-04: qty 17

## 2018-12-04 MED ORDER — SODIUM CHLORIDE 0.9 % IV SOLN
Freq: Once | INTRAVENOUS | Status: AC
  Administered 2018-12-04: 08:00:00 via INTRAVENOUS
  Filled 2018-12-04: qty 250

## 2018-12-04 NOTE — Patient Instructions (Signed)

## 2018-12-10 NOTE — Telephone Encounter (Signed)
Spoke with patient. Patient reports she is doing well post op, did received message regarding results. Patient describes a "fullness, sensitivity" in abdomen when bladder fills. Urinary frequency, is not new for her. Sensation goes away when bladder is emptied.   Denies vaginal bleeding, vaginal d/c or odor, urgency, lower back pain, fever/chills, N/V.   Advised patient to continue to monitor. Still healing internally, can be sensitive as bladder fills. Keep OV as scheduled for 1/2 with Dr. Sabra Heck, return call to office if any new symptoms develop. Advised I will review with Dr. Sabra Heck and return call if any additional recommendations. Patient agreeable.    Call reviewed with Dr. Sabra Heck- ok to keep OV as scheduled for 12/13/18.  Routing to provider for final review. Patient is agreeable to disposition. Will close encounter.

## 2018-12-11 ENCOUNTER — Ambulatory Visit: Payer: Managed Care, Other (non HMO) | Admitting: Obstetrics & Gynecology

## 2018-12-13 ENCOUNTER — Ambulatory Visit (INDEPENDENT_AMBULATORY_CARE_PROVIDER_SITE_OTHER): Payer: Managed Care, Other (non HMO) | Admitting: Obstetrics & Gynecology

## 2018-12-13 ENCOUNTER — Other Ambulatory Visit: Payer: Self-pay | Admitting: Obstetrics & Gynecology

## 2018-12-13 VITALS — BP 122/70 | HR 76 | Ht 60.0 in | Wt 157.0 lb

## 2018-12-13 DIAGNOSIS — Z9889 Other specified postprocedural states: Secondary | ICD-10-CM

## 2018-12-13 DIAGNOSIS — Z1231 Encounter for screening mammogram for malignant neoplasm of breast: Secondary | ICD-10-CM

## 2018-12-13 NOTE — Progress Notes (Signed)
GYNECOLOGY  VISIT  CC:   Post op recheck  HPI: 48 y.o. G36P3003 Married [place] American female here for two week post op for laparoscopic BSO.  Having some LLQ pain after her surgery that is improving daily.  Reviewed this was the side where ovaries were removed and incision is a little larger on this side.  Bleeding has stopped.  She is back at work full time.  No bowel or bladder issues.  Pathology and pictures reviewed.  Aware she should not have any further bleeding and if does, this is abnormal.  MMG is due.  MRI will be due in six months.  Pap neg with neg HR HPV 01/26/18.    GYNECOLOGIC HISTORY: LMP 11?28/19 Contraception: Bilateral salpingectomy, oophorectomy. Menopausal hormone therapy: none  Patient Active Problem List   Diagnosis Date Noted  . Fibroids 06/22/2018  . Genetic testing 12/19/2017  . BRCA1 gene mutation positive 12/19/2017  . Family history of breast cancer   . Family history of BRCA1 gene positive   . Sickle cell trait ([place]) 09/15/2016  . Displacement of intrauterine contraceptive device 08/31/2016  . Microcytic anemia 08/17/2016  . Hypokalemia 08/17/2016  . Iron deficiency anemia 08/17/2016  . GERD (gastroesophageal reflux disease)   . Pulmonary embolism with acute cor pulmonale ([place]) 08/16/2016  . Hemorrhoids 12/02/2015  . Adjustment disorder with depressed mood 11/28/2015  . 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 Benign neoplasm of colon 11/28/2015    Past Medical History:  Diagnosis Date  . BRCA1 gene mutation positive   . Diverticulitis   . Family history of BRCA1 gene positive   . Family history of breast cancer   . Fibroids   . GERD (gastroesophageal reflux disease)    diet controlled - no meds  . Iron deficiency anemia    received iron transfusion 08/17/2016   . Pneumonia 08/2009   one time only 2010  . Pulmonary embolism ([place]) 08/16/2016  . Sickle cell trait ([place])   . SVD (spontaneous vaginal delivery)    x 3    Past Surgical  History:  Procedure Laterality Date  . BREAST BIOPSY Right   . COLONOSCOPY     polyps  . DILATATION & CURETTAGE/HYSTEROSCOPY WITH MYOSURE N/A 11/26/2018   Procedure: DILATATION & CURETTAGE/HYSTEROSCOPY;  Surgeon: Megan Salon, MD;  Location: [facility];  Service: Gynecology;  Laterality: N/A;  . LAPAROSCOPIC BILATERAL SALPINGECTOMY Bilateral 11/26/2018   Procedure: LAPAROSCOPIC BILATERAL SALPINGECTOMY OOPHORECTOMY;  Surgeon: Megan Salon, MD;  Location: [facility];  Service: Gynecology;  Laterality: Bilateral;  LATEX ALLERGY  . WISDOM TOOTH EXTRACTION  2001    MEDS:   Current Outpatient Medications on File Prior to Visit  Medication Sig Dispense Refill  . acetaminophen (TYLENOL) 325 MG tablet Take 650 mg by mouth every 6 (six) hours as needed for mild pain.    Marland Kitchen aspirin EC 81 MG tablet Take 2 tablets (162 mg total) by mouth daily. 180 tablet 6  . cholecalciferol (VITAMIN D) 1000 units tablet Take 1,000 Units by mouth daily.    . folic acid (FOLVITE) 1 MG tablet Take 1 tablet (1 mg total) by mouth daily. 90 tablet 3  . HYDROcodone-acetaminophen (NORCO/VICODIN) 5-325 MG tablet Take 1-2 tablets by mouth every 6 (six) hours as needed for moderate pain. 15 tablet 0  . ibuprofen (ADVIL,MOTRIN) 800 MG tablet Take 1 tablet (800 mg total) by mouth every 8 (eight) hours as needed. 30 tablet 0   No current facility-administered medications on file  prior to visit.     ALLERGIES: Latex  Family History  Problem Relation Age of Onset  . Crohn's disease Mother   . Breast cancer Mother 71       recurrance at 76, died at 55  . BRCA 1/2 Mother   . Crohn's disease Sister   . Diverticulitis Brother   . Breast cancer Daughter 40  . BRCA 1/2 Daughter        BRCA1 [id number] (Splice donor)  . Breast cancer Maternal Aunt 78  . BRCA 1/2 Maternal Aunt   . Breast cancer Maternal Aunt 80       dx stage IV, lived 3 more weeks  . Heart attack Maternal Grandmother   . Cervical cancer Sister 5       pat 1/2  sister, died at 61    SH:  Married, non smoker  Review of Systems  All other systems reviewed and are negative.   PHYSICAL EXAMINATION:    BP 122/70   Pulse 76   Ht 5' (1.524 m)   Wt 157 lb (71.2 kg)   BMI 30.66 kg/m     General appearance: alert, cooperative and appears stated age CV:  Regular rate and rhythm Lungs:  clear to auscultation, no wheezes, rales or rhonchi, symmetric air entry Abdomen: soft, non-tender; bowel sounds normal; no masses,  no organomegaly Inc:  C/D/I  Assessment: H/o laparoscopic BSO due to BRCA 1 testing positive Fibroid uterus H/O PE  Plan: Recheck 1 year.   Pt aware MMR is due.  Is having this done at the breast center.  Aware to do MRI in six months.

## 2018-12-14 ENCOUNTER — Ambulatory Visit: Payer: Managed Care, Other (non HMO) | Admitting: Obstetrics & Gynecology

## 2018-12-14 ENCOUNTER — Encounter: Payer: Self-pay | Admitting: Obstetrics & Gynecology

## 2019-01-14 ENCOUNTER — Inpatient Hospital Stay: Admission: RE | Admit: 2019-01-14 | Payer: Managed Care, Other (non HMO) | Source: Ambulatory Visit

## 2019-01-18 ENCOUNTER — Ambulatory Visit
Admission: RE | Admit: 2019-01-18 | Discharge: 2019-01-18 | Disposition: A | Discharge status: Home / Self Care | Payer: Managed Care, Other (non HMO) | Source: Ambulatory Visit | Attending: Obstetrics & Gynecology | Admitting: Obstetrics & Gynecology

## 2019-01-18 DIAGNOSIS — Z1231 Encounter for screening mammogram for malignant neoplasm of breast: Secondary | ICD-10-CM

## 2019-02-22 ENCOUNTER — Inpatient Hospital Stay: Payer: Managed Care, Other (non HMO) | Admitting: Hematology & Oncology

## 2019-02-22 ENCOUNTER — Inpatient Hospital Stay: Payer: Managed Care, Other (non HMO) | Attending: Family

## 2019-08-26 ENCOUNTER — Telehealth: Payer: Self-pay | Admitting: Hematology & Oncology

## 2019-08-26 ENCOUNTER — Telehealth: Payer: Self-pay | Admitting: *Deleted

## 2019-08-26 NOTE — Telephone Encounter (Signed)
Call received from patient wanting to know if it is ok per Dr. Marin Olp if she takes Omega XL pills with Aspirin.  Call placed back to patient and patient notified per order of Dr. Marin Olp that it is ok to take Omega XL pills with Aspirin.  Pt appreciative of call back and would like to schedule an appt with Dr. Marin Olp since she has not been seen since 11/2018.  Message sent to scheduling.

## 2019-08-26 NOTE — Telephone Encounter (Signed)
Called and spoke with patient regarding appointments added per 9/14 sch msg

## 2019-09-16 ENCOUNTER — Other Ambulatory Visit: Payer: Managed Care, Other (non HMO)

## 2019-09-16 ENCOUNTER — Ambulatory Visit: Payer: Managed Care, Other (non HMO) | Admitting: Hematology & Oncology

## 2019-09-16 ENCOUNTER — Inpatient Hospital Stay: Payer: Managed Care, Other (non HMO) | Admitting: Hematology & Oncology

## 2019-09-16 ENCOUNTER — Inpatient Hospital Stay: Payer: Managed Care, Other (non HMO) | Attending: Hematology & Oncology

## 2019-09-16 DIAGNOSIS — Z7901 Long term (current) use of anticoagulants: Secondary | ICD-10-CM | POA: Insufficient documentation

## 2019-09-16 DIAGNOSIS — D509 Iron deficiency anemia, unspecified: Secondary | ICD-10-CM | POA: Insufficient documentation

## 2019-09-16 DIAGNOSIS — D573 Sickle-cell trait: Secondary | ICD-10-CM | POA: Insufficient documentation

## 2019-09-16 DIAGNOSIS — Z7982 Long term (current) use of aspirin: Secondary | ICD-10-CM | POA: Insufficient documentation

## 2019-09-16 DIAGNOSIS — Z1501 Genetic susceptibility to malignant neoplasm of breast: Secondary | ICD-10-CM | POA: Insufficient documentation

## 2019-09-16 DIAGNOSIS — Z79899 Other long term (current) drug therapy: Secondary | ICD-10-CM | POA: Insufficient documentation

## 2019-09-16 DIAGNOSIS — Z86718 Personal history of other venous thrombosis and embolism: Secondary | ICD-10-CM | POA: Insufficient documentation

## 2019-09-23 ENCOUNTER — Telehealth: Payer: Self-pay | Admitting: Hematology & Oncology

## 2019-09-23 NOTE — Telephone Encounter (Signed)
Patient called in to reschedule her missed appointments/ Appointments were moved as requested

## 2019-09-27 ENCOUNTER — Inpatient Hospital Stay (HOSPITAL_BASED_OUTPATIENT_CLINIC_OR_DEPARTMENT_OTHER): Payer: Managed Care, Other (non HMO) | Admitting: Family

## 2019-09-27 ENCOUNTER — Inpatient Hospital Stay: Payer: Managed Care, Other (non HMO)

## 2019-09-27 ENCOUNTER — Other Ambulatory Visit: Payer: Self-pay

## 2019-09-27 VITALS — BP 130/91 | HR 82 | Temp 97.3°F | Resp 17 | Wt 158.0 lb

## 2019-09-27 DIAGNOSIS — I2601 Septic pulmonary embolism with acute cor pulmonale: Secondary | ICD-10-CM

## 2019-09-27 DIAGNOSIS — Z1501 Genetic susceptibility to malignant neoplasm of breast: Secondary | ICD-10-CM

## 2019-09-27 DIAGNOSIS — D573 Sickle-cell trait: Secondary | ICD-10-CM | POA: Diagnosis not present

## 2019-09-27 DIAGNOSIS — Z7982 Long term (current) use of aspirin: Secondary | ICD-10-CM | POA: Diagnosis not present

## 2019-09-27 DIAGNOSIS — D500 Iron deficiency anemia secondary to blood loss (chronic): Secondary | ICD-10-CM

## 2019-09-27 DIAGNOSIS — Z7901 Long term (current) use of anticoagulants: Secondary | ICD-10-CM | POA: Diagnosis not present

## 2019-09-27 DIAGNOSIS — Z79899 Other long term (current) drug therapy: Secondary | ICD-10-CM | POA: Diagnosis not present

## 2019-09-27 DIAGNOSIS — Z86718 Personal history of other venous thrombosis and embolism: Secondary | ICD-10-CM | POA: Diagnosis not present

## 2019-09-27 DIAGNOSIS — D509 Iron deficiency anemia, unspecified: Secondary | ICD-10-CM | POA: Diagnosis not present

## 2019-09-27 LAB — CBC WITH DIFFERENTIAL (CANCER CENTER ONLY)
Abs Immature Granulocytes: 0.01 10*3/uL (ref 0.00–0.07)
Basophils Absolute: 0 10*3/uL (ref 0.0–0.1)
Basophils Relative: 0 %
Eosinophils Absolute: 0.1 10*3/uL (ref 0.0–0.5)
Eosinophils Relative: 1 %
HCT: 38.7 % (ref 36.0–46.0)
Hemoglobin: 13.2 g/dL (ref 12.0–15.0)
Immature Granulocytes: 0 %
Lymphocytes Relative: 45 %
Lymphs Abs: 2.2 10*3/uL (ref 0.7–4.0)
MCH: 29.1 pg (ref 26.0–34.0)
MCHC: 34.1 g/dL (ref 30.0–36.0)
MCV: 85.2 fL (ref 80.0–100.0)
Monocytes Absolute: 0.3 10*3/uL (ref 0.1–1.0)
Monocytes Relative: 5 %
Neutro Abs: 2.3 10*3/uL (ref 1.7–7.7)
Neutrophils Relative %: 49 %
Platelet Count: 264 10*3/uL (ref 150–400)
RBC: 4.54 MIL/uL (ref 3.87–5.11)
RDW: 13.6 % (ref 11.5–15.5)
WBC Count: 4.8 10*3/uL (ref 4.0–10.5)
nRBC: 0 % (ref 0.0–0.2)

## 2019-09-27 LAB — IRON AND TIBC
Iron: 107 ug/dL (ref 41–142)
Saturation Ratios: 36 % (ref 21–57)
TIBC: 298 ug/dL (ref 236–444)
UIBC: 191 ug/dL (ref 120–384)

## 2019-09-27 LAB — CMP (CANCER CENTER ONLY)
ALT: 14 U/L (ref 0–44)
AST: 19 U/L (ref 15–41)
Albumin: 4.3 g/dL (ref 3.5–5.0)
Alkaline Phosphatase: 82 U/L (ref 38–126)
Anion gap: 6 (ref 5–15)
BUN: 12 mg/dL (ref 6–20)
CO2: 28 mmol/L (ref 22–32)
Calcium: 9.6 mg/dL (ref 8.9–10.3)
Chloride: 106 mmol/L (ref 98–111)
Creatinine: 0.81 mg/dL (ref 0.44–1.00)
GFR, Est AFR Am: >60 mL/min (ref >60)
GFR, Estimated: >60 mL/min (ref >60)
Glucose, Bld: 166 mg/dL — ABNORMAL HIGH (ref 70–99)
Potassium: 4 mmol/L (ref 3.5–5.1)
Sodium: 140 mmol/L (ref 135–145)
Total Bilirubin: 0.6 mg/dL (ref 0.3–1.2)
Total Protein: 7.4 g/dL (ref 6.5–8.1)

## 2019-09-27 LAB — FERRITIN: Ferritin: 114 ng/mL (ref 11–307)

## 2019-09-27 NOTE — Progress Notes (Signed)
Hematology and Oncology Follow Up Visit  Claudia Rivas [id number] 11/07/1971 48 y.o. 09/27/2019   Principle Diagnosis:  Bilateral pulmonary emboli Chronic subpectoral lymphadenopathy Sickle Cell trait Iron deficiency anemia BRCA1 (+)  Past Therapy: Xarelto 10 mg by mouth daily maintenance therapy completed  Current Therapy:   EC ASA 017 mg po q day  Folic Acid 1 mg po q day IV iron as indicated    Interim History:  Claudia Rivas is here today for follow-up. She is having a lot of joint aches and swelling with waves and wanes.  She has been having to lift a lot of heavy boxes at work and this seems to exacerbate the aches and also cause some musculoskeletal chest aches as well.  She will try taking glucosamine chondroitin and see if this gives her some relief.  She states that she is taking her 2 baby aspirin daily as prescribed.  No episodes of bleeding, no bruising or petechiae.  No fever, chills, n/v, cough, rash, dizziness, headaches, SOB, chest pain, palpitations, abdominal pain or changes in bowel or bladder habits.  No swelling, numbness or tingling in her extremities at this time.  She has maintained a good appetite and is staying well hydrated. Her weight is stable.   ECOG Performance Status: 1 - Symptomatic but completely ambulatory  Medications:  Allergies as of 09/27/2019      Reactions   Latex Rash      Medication List       Accurate as of September 27, 2019  8:56 AM. If you have any questions, ask your nurse or doctor.        acetaminophen 325 MG tablet Commonly known as: TYLENOL Take 650 mg by mouth every 6 (six) hours as needed for mild pain.   aspirin EC 81 MG tablet Take 2 tablets (162 mg total) by mouth daily.   cholecalciferol 1000 units tablet Commonly known as: VITAMIN D Take 1,000 Units by mouth daily.   folic acid 1 MG tablet Commonly known as: FOLVITE Take 1 tablet (1 mg total) by mouth daily.       Allergies:   Allergies  Allergen Reactions  . Latex Rash    Past Medical History, Surgical history, Social history, and Family History were reviewed and updated.  Review of Systems: All other 10 point review of systems is negative.   Physical Exam:  weight is 158 lb 0.6 oz (71.7 kg). Her temporal temperature is 97.3 F (36.3 C) (abnormal). Her blood pressure is 130/91 (abnormal) and her pulse is 82. Her respiration is 17 and oxygen saturation is 100%.   Wt Readings from Last 3 Encounters:  09/27/19 158 lb 0.6 oz (71.7 kg)  12/13/18 157 lb (71.2 kg)  11/23/18 156 lb 8 oz (71 kg)    Ocular: Sclerae unicteric, pupils equal, round and reactive to light Ear-nose-throat: Oropharynx clear, dentition fair Lymphatic: No cervical or supraclavicular adenopathy Lungs no rales or rhonchi, good excursion bilaterally Heart regular rate and rhythm, no murmur appreciated Abd soft, nontender, positive bowel sounds, no liver or spleen tip palpated on exam, no fluid wave  MSK no focal spinal tenderness, no joint edema Neuro: non-focal, well-oriented, appropriate affect Breasts: Deferred   Lab Results  Component Value Date   WBC 4.8 09/27/2019   HGB 13.2 09/27/2019   HCT 38.7 09/27/2019   MCV 85.2 09/27/2019   PLT 264 09/27/2019   Lab Results  Component Value Date   FERRITIN 11 11/23/2018   IRON 70 11/23/2018  TIBC 389 11/23/2018   UIBC 318 11/23/2018   IRONPCTSAT 18 (L) 11/23/2018   Lab Results  Component Value Date   RETICCTPCT 2.1 06/20/2018   RBC 4.54 09/27/2019   No results found for: KPAFRELGTCHN, LAMBDASER, KAPLAMBRATIO No results found for: IGGSERUM, IGA, IGMSERUM No results found for: Odetta Pink, SPEI   Chemistry      Component Value Date/Time   NA 135 11/23/2018 0847   NA 143 12/11/2017 1011   K 3.7 11/23/2018 0847   K 4.0 12/11/2017 1011   CL 102 11/23/2018 0847   CL 111 (H) 10/11/2017 0847   CO2 27 11/23/2018 0847    CO2 25 12/11/2017 1011   BUN 7 11/23/2018 0847   BUN 11.2 12/11/2017 1011   CREATININE 0.87 11/23/2018 0847   CREATININE 0.9 12/11/2017 1011      Component Value Date/Time   CALCIUM 9.0 11/23/2018 0847   CALCIUM 9.4 12/11/2017 1011   ALKPHOS 48 11/23/2018 0847   ALKPHOS 70 12/11/2017 1011   AST 17 11/23/2018 0847   AST 15 12/11/2017 1011   ALT 10 11/23/2018 0847   ALT 14 12/11/2017 1011   BILITOT 0.6 11/23/2018 0847   BILITOT 0.43 12/11/2017 1011       Impression and Plan: Claudia Rivas is a very pleasant 48 yo African American female with history of PE, sickle cell trait, iron deficiency anemia and BRCA1 (+).  We will see what her iron studies show and bring her back in for infusion if needed. We will plan to see her back in another 6 months.  She will contact our office with any questions or concerns. We can certainly see her sooner if needed.    Laverna Peace, NP 10/16/20208:56 AM

## 2019-11-21 ENCOUNTER — Other Ambulatory Visit: Payer: Self-pay | Admitting: Hematology & Oncology

## 2019-11-21 DIAGNOSIS — N921 Excessive and frequent menstruation with irregular cycle: Secondary | ICD-10-CM

## 2019-11-21 DIAGNOSIS — D500 Iron deficiency anemia secondary to blood loss (chronic): Secondary | ICD-10-CM

## 2019-11-21 DIAGNOSIS — I2609 Other pulmonary embolism with acute cor pulmonale: Secondary | ICD-10-CM

## 2019-11-21 DIAGNOSIS — Z1231 Encounter for screening mammogram for malignant neoplasm of breast: Secondary | ICD-10-CM

## 2020-01-29 ENCOUNTER — Other Ambulatory Visit: Payer: Self-pay | Admitting: Hematology & Oncology

## 2020-02-07 ENCOUNTER — Ambulatory Visit: Payer: Managed Care, Other (non HMO) | Admitting: Obstetrics & Gynecology

## 2020-03-01 ENCOUNTER — Emergency Department (HOSPITAL_BASED_OUTPATIENT_CLINIC_OR_DEPARTMENT_OTHER)
Admission: EM | Admit: 2020-03-01 | Discharge: 2020-03-01 | Disposition: A | Discharge status: Home / Self Care | Payer: Managed Care, Other (non HMO) | Attending: Emergency Medicine | Admitting: Emergency Medicine

## 2020-03-01 ENCOUNTER — Encounter (HOSPITAL_BASED_OUTPATIENT_CLINIC_OR_DEPARTMENT_OTHER): Payer: Self-pay | Admitting: Emergency Medicine

## 2020-03-01 ENCOUNTER — Other Ambulatory Visit: Payer: Self-pay

## 2020-03-01 ENCOUNTER — Emergency Department (HOSPITAL_BASED_OUTPATIENT_CLINIC_OR_DEPARTMENT_OTHER): Payer: Managed Care, Other (non HMO)

## 2020-03-01 DIAGNOSIS — M79605 Pain in left leg: Secondary | ICD-10-CM | POA: Diagnosis not present

## 2020-03-01 DIAGNOSIS — R0789 Other chest pain: Secondary | ICD-10-CM | POA: Diagnosis not present

## 2020-03-01 DIAGNOSIS — Z7982 Long term (current) use of aspirin: Secondary | ICD-10-CM | POA: Insufficient documentation

## 2020-03-01 DIAGNOSIS — R079 Chest pain, unspecified: Secondary | ICD-10-CM

## 2020-03-01 DIAGNOSIS — Z91040 Latex allergy status: Secondary | ICD-10-CM | POA: Diagnosis not present

## 2020-03-01 DIAGNOSIS — Z79899 Other long term (current) drug therapy: Secondary | ICD-10-CM | POA: Insufficient documentation

## 2020-03-01 DIAGNOSIS — M79604 Pain in right leg: Secondary | ICD-10-CM | POA: Insufficient documentation

## 2020-03-01 LAB — CBC WITH DIFFERENTIAL/PLATELET
Abs Immature Granulocytes: 0.01 10*3/uL (ref 0.00–0.07)
Basophils Absolute: 0 10*3/uL (ref 0.0–0.1)
Basophils Relative: 0 %
Eosinophils Absolute: 0.1 10*3/uL (ref 0.0–0.5)
Eosinophils Relative: 2 %
HCT: 37.5 % (ref 36.0–46.0)
Hemoglobin: 12.8 g/dL (ref 12.0–15.0)
Immature Granulocytes: 0 %
Lymphocytes Relative: 38 %
Lymphs Abs: 1.6 10*3/uL (ref 0.7–4.0)
MCH: 29.4 pg (ref 26.0–34.0)
MCHC: 34.1 g/dL (ref 30.0–36.0)
MCV: 86 fL (ref 80.0–100.0)
Monocytes Absolute: 0.4 10*3/uL (ref 0.1–1.0)
Monocytes Relative: 8 %
Neutro Abs: 2.2 10*3/uL (ref 1.7–7.7)
Neutrophils Relative %: 52 %
Platelets: 216 10*3/uL (ref 150–400)
RBC: 4.36 MIL/uL (ref 3.87–5.11)
RDW: 13.6 % (ref 11.5–15.5)
WBC: 4.2 10*3/uL (ref 4.0–10.5)
nRBC: 0 % (ref 0.0–0.2)

## 2020-03-01 LAB — BASIC METABOLIC PANEL
Anion gap: 10 (ref 5–15)
BUN: 12 mg/dL (ref 6–20)
CO2: 23 mmol/L (ref 22–32)
Calcium: 8.7 mg/dL — ABNORMAL LOW (ref 8.9–10.3)
Chloride: 105 mmol/L (ref 98–111)
Creatinine, Ser: 0.84 mg/dL (ref 0.44–1.00)
GFR calc Af Amer: >60 mL/min (ref >60)
GFR calc non Af Amer: >60 mL/min (ref >60)
Glucose, Bld: 203 mg/dL — ABNORMAL HIGH (ref 70–99)
Potassium: 3.3 mmol/L — ABNORMAL LOW (ref 3.5–5.1)
Sodium: 138 mmol/L (ref 135–145)

## 2020-03-01 LAB — TROPONIN I (HIGH SENSITIVITY): Troponin I (High Sensitivity): 3 ng/L (ref <18)

## 2020-03-01 MED ORDER — POTASSIUM CHLORIDE CRYS ER 20 MEQ PO TBCR
40.0000 meq | EXTENDED_RELEASE_TABLET | Freq: Once | ORAL | Status: AC
  Administered 2020-03-01: 40 meq via ORAL
  Filled 2020-03-01: qty 2

## 2020-03-01 NOTE — ED Notes (Signed)
Pt discharged to home. Discharge instructions have been discussed with patient and/or family members. Pt verbally acknowledges understanding d/c instructions, and states understanding to checkout at registration before leaving.

## 2020-03-01 NOTE — ED Provider Notes (Signed)
[facility] Provider Note   CSN: [id number] Arrival date & time: 03/01/20  9476     History Chief Complaint  Patient presents with   Leg Pain    Claudia Rivas is a 49 y.o. female with a past medical history significant for sickle cell trait, history of PE not currently on any anticoagulants, iron deficiency anemia, history of diverticulitis, and positive BRCA1 gene mutation who presents to the ED due to gradual onset of worsening posterior bilateral leg pain x2 weeks.  Patient states pain started in her left calf which was present for about 1 week and then migrated to her right calf.  Pain radiates from the calves up to the posterior aspect of the thigh.  Patient describes sensation as achiness.  Patient has a history of DVT and PE few years prior after traveling a long distance.  She was taken off anticoagulants last year.  Patient denies injury to bilateral legs.  Patient notes achiness started after her first Covid vaccine.  She also admits to associative intermittent right-sided chest heaviness that last occurred this morning when she was getting up and typically lasts a few minutes.  Denies shortness of breath, fever, chills, lower extremity edema, lower extremity erythema, or rash.  Denies low back pain.  She has not tried anything for symptoms.  No alleviating or aggravating factors.  History obtained from patient and past medical records. No interpreter used during encounter.      Past Medical History:  Diagnosis Date   BRCA1 gene mutation positive    Diverticulitis    Family history of BRCA1 gene positive    Family history of breast cancer    Fibroids    GERD (gastroesophageal reflux disease)    diet controlled - no meds   Iron deficiency anemia    received iron transfusion 08/17/2016    Pneumonia 08/2009   one time only 2010   Pulmonary embolism ([place]) 08/16/2016   Sickle cell trait ([place])    SVD (spontaneous vaginal  delivery)    x 3    Patient Active Problem List   Diagnosis Date Noted   Fibroids 06/22/2018   Genetic testing 12/19/2017   BRCA1 gene mutation positive 12/19/2017   Family history of breast cancer    Family history of BRCA1 gene positive    Sickle cell trait ([place]) 09/15/2016   Displacement of intrauterine contraceptive device 08/31/2016   Microcytic anemia 08/17/2016   Hypokalemia 08/17/2016   Iron deficiency anemia 08/17/2016   GERD (gastroesophageal reflux disease)    Pulmonary embolism with acute cor pulmonale ([place]) 08/16/2016   Hemorrhoids 12/02/2015   Adjustment disorder with depressed mood 11/28/2015   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 11/28/2015   Benign neoplasm of colon 11/28/2015    Past Surgical History:  Procedure Laterality Date   BREAST BIOPSY Right    COLONOSCOPY     polyps   DILATATION & CURETTAGE/HYSTEROSCOPY WITH MYOSURE N/A 11/26/2018   Procedure: DILATATION & CURETTAGE/HYSTEROSCOPY;  Surgeon: Megan Salon, MD;  Location: [facility];  Service: Gynecology;  Laterality: N/A;   LAPAROSCOPIC BILATERAL SALPINGECTOMY Bilateral 11/26/2018   Procedure: LAPAROSCOPIC BILATERAL SALPINGECTOMY OOPHORECTOMY;  Surgeon: Megan Salon, MD;  Location: [facility];  Service: Gynecology;  Laterality: Bilateral;  LATEX ALLERGY   WISDOM TOOTH EXTRACTION  2001     OB History    Gravida  3   Para  3   Term  3   Preterm      AB  Living  3     SAB      TAB      Ectopic      Multiple      Live Births  3           Family History  Problem Relation Age of Onset   Crohn's disease Mother    Breast cancer Mother 58       recurrance at 82, died at 43   BRCA 1/2 Mother    Crohn's disease Sister    Diverticulitis Brother    Breast cancer Daughter 48   BRCA 1/2 Daughter        BRCA1 [id number] (Splice donor)   Breast cancer Maternal Aunt 31   BRCA 1/2 Maternal Aunt    Breast cancer Maternal Aunt 53       dx stage IV, lived 3 more weeks    Heart attack Maternal Grandmother    Cervical cancer Sister 29       pat 1/2 sister, died at 50    Social History   Tobacco Use   Smoking status: Never Smoker   Smokeless tobacco: Never Used  Substance Use Topics   Alcohol use: Yes    Comment: ocassional wine   Drug use: No    Home Medications Prior to Admission medications   Medication Sig Start Date End Date Taking? Authorizing Provider  acetaminophen (TYLENOL) 325 MG tablet Take 650 mg by mouth every 6 (six) hours as needed for mild pain.    [provider]  aspirin EC 81 MG tablet Take 2 tablets (162 mg total) by mouth daily. 08/29/18   Volanda Napoleon, MD  cholecalciferol (VITAMIN D) 1000 units tablet Take 1,000 Units by mouth daily.    [provider]  FERREX 150 150 MG capsule TAKE 1 CAPSULE TWICE A DAY 11/21/19   Volanda Napoleon, MD  folic acid (FOLVITE) 1 MG tablet Take 1 tablet (1 mg total) by mouth daily. 12/02/16   Volanda Napoleon, MD  pantoprazole (PROTONIX) 40 MG tablet TAKE 1 TABLET DAILY 01/29/20   Volanda Napoleon, MD    Allergies    Latex  Review of Systems   Review of Systems  Constitutional: Negative for chills and fever.  Respiratory: Negative for shortness of breath.   Cardiovascular: Positive for chest pain (heaviness). Negative for leg swelling.  Gastrointestinal: Negative for abdominal pain, diarrhea, nausea and vomiting.  Genitourinary: Negative for dysuria.  Musculoskeletal: Positive for arthralgias (bilateral lower extremities). Negative for back pain and joint swelling.  Neurological: Negative for weakness.  All other systems reviewed and are negative.   Physical Exam Updated Vital Signs BP (!) 140/96 (BP Location: Right Arm)    Pulse 76    Temp 98.3 F (36.8 C) (Oral)    Resp 16    SpO2 99%   Physical Exam Vitals and nursing note reviewed.  Constitutional:      General: She is not in acute distress.    Appearance: She is not ill-appearing.  HENT:     Head:  Normocephalic.  Eyes:     Pupils: Pupils are equal, round, and reactive to light.  Cardiovascular:     Rate and Rhythm: Normal rate and regular rhythm.     Pulses: Normal pulses.     Heart sounds: Normal heart sounds. No murmur. No friction rub. No gallop.   Pulmonary:     Effort: Pulmonary effort is normal.     Breath sounds: Normal breath sounds.  Chest:     Comments: Reproducible right-sided anterior chest wall tenderness.  No crepitus or deformity. Abdominal:     General: Abdomen is flat. Bowel sounds are normal. There is no distension.     Palpations: Abdomen is soft.     Tenderness: There is no abdominal tenderness. There is no guarding or rebound.  Musculoskeletal:     Cervical back: Neck supple.     Comments: No midline thoracic or lumbar tenderness.  Mild discomfort to bilateral calves.  No lower extremity edema or erythema.  Distal pulses and sensation intact.  Full range of motion of bilateral knees and ankles.  Soft compartments.  Negative Homans sign bilaterally.  Skin:    General: Skin is dry.     Comments: No lower extremity erythema or rash.  Neurological:     General: No focal deficit present.     Mental Status: She is alert.  Psychiatric:        Mood and Affect: Mood normal.        Behavior: Behavior normal.     ED Results / Procedures / Treatments   Labs (all labs ordered are listed, but only abnormal results are displayed) Labs Reviewed  BASIC METABOLIC PANEL - Abnormal; Notable for the following components:      Result Value   Potassium 3.3 (*)    Glucose, Bld 203 (*)    Calcium 8.7 (*)    All other components within normal limits  CBC WITH DIFFERENTIAL/PLATELET  TROPONIN I (HIGH SENSITIVITY)    EKG None  Radiology US Venous Img Lower Bilateral  Result Date: 03/01/2020 CLINICAL DATA:  Bilateral lower extremity pain for the past 2 weeks. EXAM: BILATERAL LOWER EXTREMITY VENOUS DOPPLER ULTRASOUND TECHNIQUE: Gray-scale sonography with graded  compression, as well as color Doppler and duplex ultrasound were performed to evaluate the lower extremity deep venous systems from the level of the common femoral vein and including the common femoral, femoral, profunda femoral, popliteal and calf veins including the posterior tibial, peroneal and gastrocnemius veins when visible. The superficial great saphenous vein was also interrogated. Spectral Doppler was utilized to evaluate flow at rest and with distal augmentation maneuvers in the common femoral, femoral and popliteal veins. COMPARISON:  None. FINDINGS: RIGHT LOWER EXTREMITY Common Femoral Vein: No evidence of thrombus. Normal compressibility, respiratory phasicity and response to augmentation. Saphenofemoral Junction: No evidence of thrombus. Normal compressibility and flow on color Doppler imaging. Profunda Femoral Vein: No evidence of thrombus. Normal compressibility and flow on color Doppler imaging. Femoral Vein: No evidence of thrombus. Normal compressibility, respiratory phasicity and response to augmentation. Popliteal Vein: No evidence of thrombus. Normal compressibility, respiratory phasicity and response to augmentation. Calf Veins: No evidence of thrombus. Normal compressibility and flow on color Doppler imaging. Superficial Great Saphenous Vein: No evidence of thrombus. Normal compressibility. Venous Reflux:  None. Other Findings:  None. LEFT LOWER EXTREMITY Common Femoral Vein: No evidence of thrombus. Normal compressibility, respiratory phasicity and response to augmentation. Saphenofemoral Junction: No evidence of thrombus. Normal compressibility and flow on color Doppler imaging. Profunda Femoral Vein: No evidence of thrombus. Normal compressibility and flow on color Doppler imaging. Femoral Vein: No evidence of thrombus. Normal compressibility, respiratory phasicity and response to augmentation. Popliteal Vein: No evidence of thrombus. Normal compressibility, respiratory phasicity and  response to augmentation. Calf Veins: No evidence of thrombus. Normal compressibility and flow on color Doppler imaging. Superficial Great Saphenous Vein: No evidence of thrombus. Normal compressibility. Venous Reflux:  None. Other Findings:  None. IMPRESSION:  No evidence of deep venous thrombosis in either lower extremity. Electronically Signed   By: Jacqulynn Cadet M.D.   On: 03/01/2020 11:05    Procedures Procedures (including critical care time)  Medications Ordered in ED Medications  potassium chloride SA (KLOR-CON) CR tablet 40 mEq (40 mEq Oral Given 03/01/20 1051)    ED Course  I have reviewed the triage vital signs and the nursing notes.  Pertinent labs & imaging results that were available during my care of the patient were reviewed by me and considered in my medical decision making (see chart for details).  Clinical Course as of Mar 01 1124  Sun Mar 01, 2020  1043 Potassium(!): 3.3 [CA]  1043 Glucose(!): 203 [CA]    Clinical Course User Index [CA] Suzy Bouchard, PA-C   MDM Rules/Calculators/A&P                     49 year old female presents to the ED for an evaluation of bilateral posterior lower extremity achiness x2 weeks after receiving her first Covid vaccine.  Patient has a history of DVT/PE and not currently on any anticoagulants.  Admits to mild intermittent associative right-sided chest heaviness, but denies shortness of breath.  Stable vitals.  Patient is afebrile, not tachycardic or hypoxic.  Patient no acute distress and non-ill-appearing.  Lower extremity neurovascularly intact.  No erythema, edema, or rash.  No thoracic or lumbar midline tenderness.  Reproducible right-sided anterior chest wall tenderness.  Will obtain routine labs to check for electrolyte abnormalities and troponin to rule out ACS.  Low suspicion given reproducible nature on exam.  Given patient's history of DVT/PE will obtain ultrasound to rule out DVT. Discussed case with Dr. Roslynn Amble who  evaluated patient at bedside and agrees with assessment and plan.   CBC unremarkable with no leukocytosis.  Troponin normal at 3.  EKG personally reviewed which demonstrates normal sinus rhythm with no signs of acute ischemia. Doubt ACS given reproducible nature and atypical story. No delta troponin warranted at this time. BMP significant for hypokalemia at 3.3, but otherwise reassuring.  Potassium repleted here in the ED. ultrasound personally reviewed which is negative for any signs of DVT.  Doubt DVT/PE.  Suspect patient symptoms related to myalgias after the Covid vaccine.  Advised patient to take over-the-counter ibuprofen or Tylenol if she develops worsening pain.  Instructed patient to follow-up with PCP within the next week if symptoms do not improve. Strict ED precautions discussed with patient. Patient states understanding and agrees to plan. Patient discharged home in no acute distress and stable vitals.  Final Clinical Impression(s) / ED Diagnoses Final diagnoses:  Bilateral leg pain  Nonspecific chest pain    Rx / DC Orders ED Discharge Orders    None       Karie Kirks 03/01/20 1125    Lucrezia Starch, MD 03/01/20 1455

## 2020-03-01 NOTE — ED Triage Notes (Signed)
Pt here with bilateral leg pain x 2 weeks. No cramping. Hx of blood clots.

## 2020-03-01 NOTE — Discharge Instructions (Addendum)
As discussed, your ultrasound was negative for any blood clots in your legs.  Your labs were reassuring.  Your potassium was mildly decreased.  You were given potassium here in the ER.  You may take over-the-counter ibuprofen or Tylenol as needed for pain.  Follow-up with your PCP if symptoms do not improve within the next week.  Return to the ER for new or worsening symptoms.

## 2020-03-04 NOTE — Progress Notes (Deleted)
49 y.o. G62P3003 Married Claudia Rivas or [place] American female here for annual exam.    No LMP recorded.          Sexually active: {yes no:314532}  The current method of family planning is laparoscopic BSO.    Exercising: {yes no:314532}  {types:19826} Smoker:  {YES NO:22349}  Health Maintenance: Pap:  01-26-18 neg HPV HR eng History of abnormal Pap:  {YES NO:22349} MMG:  01-18-2019 category b density birads 1:neg Colonoscopy:  *** BMD:   *** TDaP:  2010 Pneumonia vaccine(s):  2010 Shingrix:   *** Hep C testing: *** Screening Labs: ***   reports that she has never smoked. She has never used smokeless tobacco. She reports current alcohol use. She reports that she does not use drugs.  Past Medical History:  Diagnosis Date  . BRCA1 gene mutation positive   . Diverticulitis   . Family history of BRCA1 gene positive   . Family history of breast cancer   . Fibroids   . GERD (gastroesophageal reflux disease)    diet controlled - no meds  . Iron deficiency anemia    received iron transfusion 08/17/2016   . Pneumonia 08/2009   one time only 2010  . Pulmonary embolism ([address]) 08/16/2016  . Sickle cell trait ([place])   . SVD (spontaneous vaginal delivery)    x 3    Past Surgical History:  Procedure Laterality Date  . BREAST BIOPSY Right   . COLONOSCOPY     polyps  . DILATATION & CURETTAGE/HYSTEROSCOPY WITH MYOSURE N/A 11/26/2018   Procedure: DILATATION & CURETTAGE/HYSTEROSCOPY;  Surgeon: Megan Salon, MD;  Location: [facility];  Service: Gynecology;  Laterality: N/A;  . LAPAROSCOPIC BILATERAL SALPINGECTOMY Bilateral 11/26/2018   Procedure: LAPAROSCOPIC BILATERAL SALPINGECTOMY OOPHORECTOMY;  Surgeon: Megan Salon, MD;  Location: [facility];  Service: Gynecology;  Laterality: Bilateral;  LATEX ALLERGY  . WISDOM TOOTH EXTRACTION  2001    Current Outpatient Medications  Medication Sig Dispense Refill  . acetaminophen (TYLENOL) 325 MG tablet Take 650 mg by mouth every 6 (six) hours as needed for  mild pain.    Marland Kitchen aspirin EC 81 MG tablet Take 2 tablets (162 mg total) by mouth daily. 180 tablet 6  . cholecalciferol (VITAMIN D) 1000 units tablet Take 1,000 Units by mouth daily.    Marland Kitchen FERREX 150 150 MG capsule TAKE 1 CAPSULE TWICE A DAY 419 capsule 3  . folic acid (FOLVITE) 1 MG tablet Take 1 tablet (1 mg total) by mouth daily. 90 tablet 3  . pantoprazole (PROTONIX) 40 MG tablet TAKE 1 TABLET DAILY 90 tablet 3   No current facility-administered medications for this visit.    Family History  Problem Relation Age of Onset  . Crohn's disease Mother   . Breast cancer Mother 75       recurrance at 48, died at 69  . BRCA 1/2 Mother   . Crohn's disease Sister   . Diverticulitis Brother   . Breast cancer Daughter 52  . BRCA 1/2 Daughter        BRCA1 Q.[id number]+[id number]>G (Splice donor)  . Breast cancer Maternal Aunt 17  . BRCA 1/2 Maternal Aunt   . Breast cancer Maternal Aunt 59       dx stage IV, lived 3 more weeks  . Heart attack Maternal Grandmother   . Cervical cancer Sister 45       pat 1/2 sister, died at 2    Review of Systems  Exam:   There were  no vitals taken for this visit.  Height:      Ht Readings from Last 3 Encounters:  12/13/18 5' (1.524 m)  11/23/18 5' (1.524 m)  11/16/18 '5\' 1"'  (1.549 m)    General appearance: alert, cooperative and appears stated age Head: Normocephalic, without obvious abnormality, atraumatic Neck: no adenopathy, supple, symmetrical, trachea midline and thyroid {EXAM; THYROID:18604} Lungs: clear to auscultation bilaterally Breasts: {Exam; breast:13139::"normal appearance, no masses or tenderness"} Heart: regular rate and rhythm Abdomen: soft, non-tender; bowel sounds normal; no masses,  no organomegaly Extremities: extremities normal, atraumatic, no cyanosis or edema Skin: Skin color, texture, turgor normal. No rashes or lesions Lymph nodes: Cervical, supraclavicular, and axillary nodes normal. No abnormal inguinal nodes palpated Neurologic:  Grossly normal   Pelvic: External genitalia:  no lesions              Urethra:  normal appearing urethra with no masses, tenderness or lesions              Bartholins and Skenes: normal                 Vagina: normal appearing vagina with normal color and discharge, no lesions              Cervix: {exam; cervix:14595}              Pap taken: {yes no:314532} Bimanual Exam:  Uterus:  {exam; uterus:12215}              Adnexa: {exam; adnexa:12223}               Rectovaginal: Confirms               Anus:  normal sphincter tone, no lesions  Chaperone, ***Terence Lux, CMA, was present for exam.  A:  Well Woman with normal exam  P:   {plan; gyn:5269::"mammogram","pap smear","return annually or prn"}

## 2020-03-16 ENCOUNTER — Ambulatory Visit: Payer: Managed Care, Other (non HMO) | Admitting: Obstetrics & Gynecology

## 2020-03-23 NOTE — Progress Notes (Signed)
49 y.o. G18P3003 Married [place] or [place] American female here for annual exam.  Doing well.  Daughter has completed her therapy but having bleeding issues.  Last time pt was there was May, 2020.    Pt is now off Xeralto and taking two ASA 81 mg daily.  Went to ER 03/01/2020 with lower extremity leg pain.  Doppler was negative.  Lab work showed K level 3.3.    PCP:  Dr. Benjamine Mola.  She retired but has a new provider.  Saw Dr. Benjamine Mola in late Nov/early Dec 2020.    Patient's last menstrual period was 11/11/2018.          Sexually active: Yes.    The current method of family planning is BSO.    Exercising: Yes.    walks all with both jobs Smoker:  no  Health Maintenance: Pap: 01-26-18 Neg:Neg HR HPV, 2014 Neg History of abnormal Pap:  Yes, repeat PAP -WNL  MMG: 01-18-19 3D/Neg/density B/BiRads1.  Aware this is due.   Colonoscopy: 2014 polyps; next 5 years and patient feels she had this done. BMD:   n/a TDaP: 10-26-09--would like but had 2nd COVID vaccine 03-11-20 Pneumonia vaccine(s):  08-12-09 Shingrix:   no Hep C testing: 2010 Neg per patient Screening Labs: done with hematology/oncology, Dr. Benjamine Mola   reports that she has never smoked. She has never used smokeless tobacco. She reports current alcohol use. She reports that she does not use drugs.  Past Medical History:  Diagnosis Date  . BRCA1 gene mutation positive   . Diverticulitis   . Family history of BRCA1 gene positive   . Family history of breast cancer   . Fibroids   . GERD (gastroesophageal reflux disease)    diet controlled - no meds  . Iron deficiency anemia    received iron transfusion 08/17/2016   . Plantar fasciitis    right foot  . Pneumonia 08/2009   one time only 2010  . Pulmonary embolism ([place]) 08/16/2016  . Sickle cell trait ([place])   . SVD (spontaneous vaginal delivery)    x 3    Past Surgical History:  Procedure Laterality Date  . BREAST BIOPSY Right   . COLONOSCOPY     polyps  . DILATATION &  CURETTAGE/HYSTEROSCOPY WITH MYOSURE N/A 11/26/2018   Procedure: DILATATION & CURETTAGE/HYSTEROSCOPY;  Surgeon: Megan Salon, MD;  Location: [facility];  Service: Gynecology;  Laterality: N/A;  . LAPAROSCOPIC BILATERAL SALPINGECTOMY Bilateral 11/26/2018   Procedure: LAPAROSCOPIC BILATERAL SALPINGECTOMY OOPHORECTOMY;  Surgeon: Megan Salon, MD;  Location: [facility];  Service: Gynecology;  Laterality: Bilateral;  LATEX ALLERGY  . WISDOM TOOTH EXTRACTION  2001    Current Outpatient Medications  Medication Sig Dispense Refill  . acetaminophen (TYLENOL) 325 MG tablet Take 650 mg by mouth every 6 (six) hours as needed for mild pain.    Marland Kitchen aspirin EC 81 MG tablet Take 2 tablets (162 mg total) by mouth daily. 180 tablet 6  . cholecalciferol (VITAMIN D) 1000 units tablet Take 1,000 Units by mouth daily.    . folic acid (FOLVITE) 1 MG tablet Take 1 tablet (1 mg total) by mouth daily. 90 tablet 3  . glucosamine-chondroitin 500-400 MG tablet Take 1 tablet by mouth 2 (two) times daily.    . Multiple Vitamin (MULTIVITAMIN) capsule Take 1 capsule by mouth daily.     No current facility-administered medications for this visit.    Family History  Problem Relation Age of Onset  . Crohn's disease Mother   .  Breast cancer Mother 93       recurrance at 24, died at 64  . BRCA 1/2 Mother   . Crohn's disease Sister   . Diverticulitis Brother   . Breast cancer Daughter 31  . BRCA 1/2 Daughter        BRCA1 X.3235+5D>D (Splice donor)  . Breast cancer Maternal Aunt 3  . BRCA 1/2 Maternal Aunt   . Breast cancer Maternal Aunt 53       dx stage IV, lived 3 more weeks  . Heart attack Maternal Grandmother   . Cervical cancer Sister 26       pat 1/2 sister, died at 33    Review of Systems  Musculoskeletal: Positive for arthralgias.       Joint aches  All other systems reviewed and are negative.   Exam:   BP 124/76   Pulse 76   Temp (!) 97.2 F (36.2 C) (Temporal)   Resp 20   Ht '5\' 1"'$  (1.549 m)   Wt 162  lb 9.6 oz (73.8 kg)   LMP 11/11/2018   BMI 30.72 kg/m    Height: '5\' 1"'$  (154.9 cm)  Ht Readings from Last 3 Encounters:  03/24/20 '5\' 1"'$  (1.549 m)  12/13/18 5' (1.524 m)  11/23/18 5' (1.524 m)    General appearance: alert, cooperative and appears stated age Head: Normocephalic, without obvious abnormality, atraumatic Neck: no adenopathy, supple, symmetrical, trachea midline and thyroid normal to inspection and palpation Lungs: clear to auscultation bilaterally Breasts: normal appearance, no masses or tenderness Heart: regular rate and rhythm Abdomen: soft, non-tender; bowel sounds normal; no masses,  no organomegaly Extremities: extremities normal, atraumatic, no cyanosis or edema Skin: Skin color, texture, turgor normal. No rashes or lesions Lymph nodes: Cervical, supraclavicular, and axillary nodes normal. No abnormal inguinal nodes palpated Neurologic: Grossly normal  Pelvic: External genitalia:  no lesions              Urethra:  normal appearing urethra with no masses, tenderness or lesions              Bartholins and Skenes: normal                 Vagina: normal appearing vagina with normal color and discharge, no lesions              Cervix: no lesions              Pap taken: No. Bimanual Exam:  Uterus: 8cm size uterus, irregular contour but much smaller in size              Adnexa: normal adnexa and no mass, fullness, tenderness               Rectovaginal: Confirms               Anus:  normal sphincter tone, no lesions  Chaperone, Marisa Sprinkles, CMA, was present for exam.  A:  Well Woman with normal exam Recent ER visit due to LE pain.  Had hypokalemia H/O PE, followed by Dr. Marin Olp, now on '162mg'$  ASA BRCA 1 gene mutation H/o fibroids, uterus much smaller on exam today  P:   Mammogram overdue.  Pt aware.  Will have MRI scheduled after that time pap smear with neg HR HPV 01/2018.  Not indicated today Release for colonoscopy signed today to ensure this is due. Repeat BMP  obtained today BMD not indicated at this time She will return for Tdap Return annually or  prn

## 2020-03-24 ENCOUNTER — Encounter: Payer: Self-pay | Admitting: Obstetrics & Gynecology

## 2020-03-24 ENCOUNTER — Ambulatory Visit: Payer: Managed Care, Other (non HMO) | Admitting: Obstetrics & Gynecology

## 2020-03-24 ENCOUNTER — Other Ambulatory Visit: Payer: Self-pay

## 2020-03-24 VITALS — BP 124/76 | HR 76 | Temp 97.2°F | Resp 20 | Ht 61.0 in | Wt 162.6 lb

## 2020-03-24 DIAGNOSIS — Z86711 Personal history of pulmonary embolism: Secondary | ICD-10-CM | POA: Diagnosis not present

## 2020-03-24 DIAGNOSIS — E876 Hypokalemia: Secondary | ICD-10-CM

## 2020-03-24 DIAGNOSIS — Z1371 Encounter for nonprocreative screening for genetic disease carrier status: Secondary | ICD-10-CM | POA: Diagnosis not present

## 2020-03-24 DIAGNOSIS — Z01419 Encounter for gynecological examination (general) (routine) without abnormal findings: Secondary | ICD-10-CM | POA: Diagnosis not present

## 2020-03-25 LAB — BASIC METABOLIC PANEL
BUN/Creatinine Ratio: 15 (ref 9–23)
BUN: 10 mg/dL (ref 6–24)
CO2: 24 mmol/L (ref 20–29)
Calcium: 9.3 mg/dL (ref 8.7–10.2)
Chloride: 110 mmol/L — ABNORMAL HIGH (ref 96–106)
Creatinine, Ser: 0.68 mg/dL (ref 0.57–1.00)
GFR calc Af Amer: 118 mL/min/{1.73_m2} (ref >59)
GFR calc non Af Amer: 102 mL/min/{1.73_m2} (ref >59)
Glucose: 84 mg/dL (ref 65–99)
Potassium: 4.1 mmol/L (ref 3.5–5.2)
Sodium: 145 mmol/L — ABNORMAL HIGH (ref 134–144)

## 2020-03-27 ENCOUNTER — Inpatient Hospital Stay: Payer: Managed Care, Other (non HMO) | Attending: Hematology & Oncology | Admitting: Hematology & Oncology

## 2020-03-27 ENCOUNTER — Inpatient Hospital Stay: Payer: Managed Care, Other (non HMO)

## 2020-03-27 ENCOUNTER — Other Ambulatory Visit: Payer: Self-pay

## 2020-03-27 ENCOUNTER — Encounter: Payer: Self-pay | Admitting: Hematology & Oncology

## 2020-03-27 VITALS — BP 125/88 | HR 70 | Temp 97.9°F | Resp 18 | Wt 161.0 lb

## 2020-03-27 DIAGNOSIS — Z7901 Long term (current) use of anticoagulants: Secondary | ICD-10-CM | POA: Insufficient documentation

## 2020-03-27 DIAGNOSIS — I2782 Chronic pulmonary embolism: Secondary | ICD-10-CM

## 2020-03-27 DIAGNOSIS — D573 Sickle-cell trait: Secondary | ICD-10-CM | POA: Insufficient documentation

## 2020-03-27 DIAGNOSIS — Z86711 Personal history of pulmonary embolism: Secondary | ICD-10-CM | POA: Diagnosis not present

## 2020-03-27 DIAGNOSIS — D509 Iron deficiency anemia, unspecified: Secondary | ICD-10-CM | POA: Diagnosis not present

## 2020-03-27 DIAGNOSIS — I2601 Septic pulmonary embolism with acute cor pulmonale: Secondary | ICD-10-CM | POA: Diagnosis not present

## 2020-03-27 DIAGNOSIS — M129 Arthropathy, unspecified: Secondary | ICD-10-CM | POA: Insufficient documentation

## 2020-03-27 DIAGNOSIS — R591 Generalized enlarged lymph nodes: Secondary | ICD-10-CM | POA: Insufficient documentation

## 2020-03-27 DIAGNOSIS — D500 Iron deficiency anemia secondary to blood loss (chronic): Secondary | ICD-10-CM

## 2020-03-27 LAB — CMP (CANCER CENTER ONLY)
ALT: 22 U/L (ref 0–44)
AST: 23 U/L (ref 15–41)
Albumin: 4.1 g/dL (ref 3.5–5.0)
Alkaline Phosphatase: 72 U/L (ref 38–126)
Anion gap: 7 (ref 5–15)
BUN: 10 mg/dL (ref 6–20)
CO2: 26 mmol/L (ref 22–32)
Calcium: 9.2 mg/dL (ref 8.9–10.3)
Chloride: 106 mmol/L (ref 98–111)
Creatinine: 0.78 mg/dL (ref 0.44–1.00)
GFR, Est AFR Am: >60 mL/min (ref >60)
GFR, Estimated: >60 mL/min (ref >60)
Glucose, Bld: 189 mg/dL — ABNORMAL HIGH (ref 70–99)
Potassium: 4.1 mmol/L (ref 3.5–5.1)
Sodium: 139 mmol/L (ref 135–145)
Total Bilirubin: 0.5 mg/dL (ref 0.3–1.2)
Total Protein: 7 g/dL (ref 6.5–8.1)

## 2020-03-27 LAB — CBC WITH DIFFERENTIAL (CANCER CENTER ONLY)
Abs Immature Granulocytes: 0.05 10*3/uL (ref 0.00–0.07)
Basophils Absolute: 0 10*3/uL (ref 0.0–0.1)
Basophils Relative: 0 %
Eosinophils Absolute: 0.1 10*3/uL (ref 0.0–0.5)
Eosinophils Relative: 1 %
HCT: 38.4 % (ref 36.0–46.0)
Hemoglobin: 13 g/dL (ref 12.0–15.0)
Immature Granulocytes: 1 %
Lymphocytes Relative: 43 %
Lymphs Abs: 2.1 10*3/uL (ref 0.7–4.0)
MCH: 28.7 pg (ref 26.0–34.0)
MCHC: 33.9 g/dL (ref 30.0–36.0)
MCV: 84.8 fL (ref 80.0–100.0)
Monocytes Absolute: 0.3 10*3/uL (ref 0.1–1.0)
Monocytes Relative: 6 %
Neutro Abs: 2.4 10*3/uL (ref 1.7–7.7)
Neutrophils Relative %: 49 %
Platelet Count: 254 10*3/uL (ref 150–400)
RBC: 4.53 MIL/uL (ref 3.87–5.11)
RDW: 13.4 % (ref 11.5–15.5)
WBC Count: 4.9 10*3/uL (ref 4.0–10.5)
nRBC: 0 % (ref 0.0–0.2)

## 2020-03-27 LAB — IRON AND TIBC
Iron: 100 ug/dL (ref 41–142)
Saturation Ratios: 33 % (ref 21–57)
TIBC: 304 ug/dL (ref 236–444)
UIBC: 204 ug/dL (ref 120–384)

## 2020-03-27 LAB — FERRITIN: Ferritin: 135 ng/mL (ref 11–307)

## 2020-03-27 LAB — RETICULOCYTES
Immature Retic Fract: 13.2 % (ref 2.3–15.9)
RBC.: 4.5 MIL/uL (ref 3.87–5.11)
Retic Count, Absolute: 61.7 10*3/uL (ref 19.0–186.0)
Retic Ct Pct: 1.4 % (ref 0.4–3.1)

## 2020-03-27 NOTE — Progress Notes (Signed)
Hematology and Oncology Follow Up Visit  Claudia Rivas [id number] 11-22-1971 49 y.o. 03/27/2020   Principle Diagnosis:  Bilateral pulmonary emboli Chronic subpectoral lymphadenopathy Sickle Cell trait Iron deficiency anemia BRCA1 (+)  Past Therapy: Xarelto 10 mg by mouth daily maintenance therapy completed  Current Therapy:   EC ASA 409 mg po q day  Folic Acid 1 mg po q day IV iron as indicated    Interim History:  Claudia Rivas is here today for follow-up.  She is doing okay.  She is having some issues with respect to her work schedule.  She has different hours.  Because of this, she does have a difficult time with sleep.  I told her that she could take over-the-counter melatonin.  Somehow, she seemed to think that this was going to affect her estrogen levels.  I told her that this would not.  She also is having issues with arthritis.  She does a lot of work.  She is a Biomedical scientist at [employer].  She is quite busy when she works.  I told her to try some turmeric capsules.  These are natural.  I do not think these would upset her stomach.  She has had no problems with bowels or bladder.  She has had no obvious bleeding.  She has had no leg swelling.  Overall, her performance status is ECOG 1.    Medications:  Allergies as of 03/27/2020      Reactions   Latex Rash      Medication List       Accurate as of March 27, 2020  9:50 AM. If you have any questions, ask your nurse or doctor.        acetaminophen 325 MG tablet Commonly known as: TYLENOL Take 650 mg by mouth every 6 (six) hours as needed for mild pain.   aspirin EC 81 MG tablet Take 2 tablets (162 mg total) by mouth daily.   cholecalciferol 1000 units tablet Commonly known as: VITAMIN D Take 1,000 Units by mouth daily.   folic acid 1 MG tablet Commonly known as: FOLVITE Take 1 tablet (1 mg total) by mouth daily.   glucosamine-chondroitin 500-400 MG tablet Take 1 tablet by mouth 2 (two)  times daily.   multivitamin capsule Take 1 capsule by mouth daily.       Allergies:  Allergies  Allergen Reactions  . Latex Rash    Past Medical History, Surgical history, Social history, and Family History were reviewed and updated.  Review of Systems: Review of Systems  Constitutional: Negative.   HENT: Negative.   Eyes: Negative.   Respiratory: Negative.   Cardiovascular: Negative.   Gastrointestinal: Negative.   Genitourinary: Negative.   Musculoskeletal: Positive for joint pain and myalgias.  Skin: Negative.   Neurological: Negative.   Endo/Heme/Allergies: Negative.   Psychiatric/Behavioral: The patient has insomnia.      Physical Exam:  weight is 161 lb (73 kg). Her temporal temperature is 97.9 F (36.6 C). Her blood pressure is 125/88 and her pulse is 70. Her respiration is 18 and oxygen saturation is 100%.   Wt Readings from Last 3 Encounters:  03/27/20 161 lb (73 kg)  03/24/20 162 lb 9.6 oz (73.8 kg)  09/27/19 158 lb 0.6 oz (71.7 kg)    Physical Exam Vitals reviewed.  HENT:     Head: Normocephalic and atraumatic.  Eyes:     Pupils: Pupils are equal, round, and reactive to light.  Cardiovascular:     Rate and  Rhythm: Normal rate and regular rhythm.     Heart sounds: Normal heart sounds.  Pulmonary:     Effort: Pulmonary effort is normal.     Breath sounds: Normal breath sounds.  Abdominal:     General: Bowel sounds are normal.     Palpations: Abdomen is soft.  Musculoskeletal:        General: No tenderness or deformity. Normal range of motion.     Cervical back: Normal range of motion.  Lymphadenopathy:     Cervical: No cervical adenopathy.  Skin:    General: Skin is warm and dry.     Findings: No erythema or rash.  Neurological:     Mental Status: She is alert and oriented to person, place, and time.  Psychiatric:        Behavior: Behavior normal.        Thought Content: Thought content normal.        Judgment: Judgment normal.       Lab Results  Component Value Date   WBC 4.9 03/27/2020   HGB 13.0 03/27/2020   HCT 38.4 03/27/2020   MCV 84.8 03/27/2020   PLT 254 03/27/2020   Lab Results  Component Value Date   FERRITIN 114 09/27/2019   IRON 107 09/27/2019   TIBC 298 09/27/2019   UIBC 191 09/27/2019   IRONPCTSAT 36 09/27/2019   Lab Results  Component Value Date   RETICCTPCT 1.4 03/27/2020   RBC 4.53 03/27/2020   RBC 4.50 03/27/2020   No results found for: KPAFRELGTCHN, LAMBDASER, KAPLAMBRATIO No results found for: IGGSERUM, IGA, IGMSERUM No results found for: Odetta Pink, SPEI   Chemistry      Component Value Date/Time   NA 139 03/27/2020 0839   NA 145 (H) 03/24/2020 1152   NA 143 12/11/2017 1011   K 4.1 03/27/2020 0839   K 4.0 12/11/2017 1011   CL 106 03/27/2020 0839   CL 111 (H) 10/11/2017 0847   CO2 26 03/27/2020 0839   CO2 25 12/11/2017 1011   BUN 10 03/27/2020 0839   BUN 10 03/24/2020 1152   BUN 11.2 12/11/2017 1011   CREATININE 0.78 03/27/2020 0839   CREATININE 0.9 12/11/2017 1011      Component Value Date/Time   CALCIUM 9.2 03/27/2020 0839   CALCIUM 9.4 12/11/2017 1011   ALKPHOS 72 03/27/2020 0839   ALKPHOS 70 12/11/2017 1011   AST 23 03/27/2020 0839   AST 15 12/11/2017 1011   ALT 22 03/27/2020 0839   ALT 14 12/11/2017 1011   BILITOT 0.5 03/27/2020 0839   BILITOT 0.43 12/11/2017 1011       Impression and Plan: Claudia Rivas is a very pleasant 49 yo African American female with history of PE, sickle cell trait, iron deficiency anemia and BRCA1 (+).   We will see what her iron studies show and bring her back in for infusion if needed.  We will plan to see her back in another 6 months.   She will contact our office with any questions or concerns. We can certainly see her sooner if needed.    Volanda Napoleon, MD 4/16/20219:50 AM

## 2020-03-30 ENCOUNTER — Ambulatory Visit: Payer: Managed Care, Other (non HMO)

## 2020-04-06 ENCOUNTER — Ambulatory Visit (INDEPENDENT_AMBULATORY_CARE_PROVIDER_SITE_OTHER): Payer: Managed Care, Other (non HMO)

## 2020-04-06 ENCOUNTER — Other Ambulatory Visit: Payer: Self-pay

## 2020-04-06 VITALS — BP 136/78 | HR 88 | Temp 97.2°F | Ht 61.0 in | Wt 161.4 lb

## 2020-04-06 DIAGNOSIS — Z23 Encounter for immunization: Secondary | ICD-10-CM | POA: Diagnosis not present

## 2020-04-06 NOTE — Progress Notes (Signed)
Patient in today for Tdap injection.   Injection given in left deltoid. Patient tolerated shot well.   Routed to provider for final review.  Encounter closed.

## 2020-04-15 ENCOUNTER — Other Ambulatory Visit: Payer: Self-pay | Admitting: Obstetrics & Gynecology

## 2020-04-15 DIAGNOSIS — Z1231 Encounter for screening mammogram for malignant neoplasm of breast: Secondary | ICD-10-CM

## 2020-04-16 ENCOUNTER — Telehealth: Payer: Self-pay

## 2020-04-16 NOTE — Telephone Encounter (Signed)
Spoke with patient to let her know that [facility] sent [place] our release back regarding her colonoscopy, saying that they could not locate her in their system by her name & date of birth. Patient states she will call them. Routing to Dr Sabra Heck.

## 2020-04-23 ENCOUNTER — Other Ambulatory Visit: Payer: Self-pay

## 2020-04-23 ENCOUNTER — Ambulatory Visit
Admission: RE | Admit: 2020-04-23 | Discharge: 2020-04-23 | Disposition: A | Discharge status: Home / Self Care | Payer: Managed Care, Other (non HMO) | Source: Ambulatory Visit | Attending: Obstetrics & Gynecology | Admitting: Obstetrics & Gynecology

## 2020-04-23 DIAGNOSIS — Z1231 Encounter for screening mammogram for malignant neoplasm of breast: Secondary | ICD-10-CM

## 2020-04-24 ENCOUNTER — Other Ambulatory Visit: Payer: Self-pay | Admitting: Obstetrics & Gynecology

## 2020-04-24 DIAGNOSIS — R928 Other abnormal and inconclusive findings on diagnostic imaging of breast: Secondary | ICD-10-CM

## 2020-04-29 NOTE — Telephone Encounter (Signed)
Patient is calling for Joy. Patient states she has located the office she received a colonoscopy at and will need to sign a release form there today (04/29/20). Patient stated for Joy to give her a call back.

## 2020-04-30 ENCOUNTER — Other Ambulatory Visit: Payer: Self-pay

## 2020-04-30 ENCOUNTER — Ambulatory Visit
Admission: RE | Admit: 2020-04-30 | Discharge: 2020-04-30 | Disposition: A | Discharge status: Home / Self Care | Payer: Managed Care, Other (non HMO) | Source: Ambulatory Visit | Attending: Obstetrics & Gynecology | Admitting: Obstetrics & Gynecology

## 2020-04-30 ENCOUNTER — Other Ambulatory Visit: Payer: Self-pay | Admitting: Obstetrics & Gynecology

## 2020-04-30 DIAGNOSIS — R928 Other abnormal and inconclusive findings on diagnostic imaging of breast: Secondary | ICD-10-CM

## 2020-04-30 DIAGNOSIS — R921 Mammographic calcification found on diagnostic imaging of breast: Secondary | ICD-10-CM

## 2020-05-04 ENCOUNTER — Telehealth: Payer: Self-pay

## 2020-05-04 DIAGNOSIS — Z1211 Encounter for screening for malignant neoplasm of colon: Secondary | ICD-10-CM

## 2020-05-04 NOTE — Telephone Encounter (Signed)
Left message to call back  

## 2020-05-04 NOTE — Telephone Encounter (Signed)
Patient is returning call for Joy.

## 2020-05-04 NOTE — Telephone Encounter (Signed)
Spoke with patient. She states she had colonoscopy records faxed to [place] on Thursday.

## 2020-05-07 NOTE — Telephone Encounter (Signed)
I do not have any colonoscopy records for this pt.  Thanks.

## 2020-05-07 NOTE — Telephone Encounter (Signed)
Have you received any results. Routed to [place].

## 2020-05-07 NOTE — Telephone Encounter (Signed)
Have you received any results. Routing to Dr Sabra Heck.

## 2020-05-12 NOTE — Telephone Encounter (Signed)
Referral placed to Dr. Dorrene German for screening colonoscopy.  Ok to close encounter.

## 2020-05-12 NOTE — Telephone Encounter (Signed)
Patient asking if we are scheduling the appointment for her. She is aware information was sent to Dr Sabra Heck & that someone will be giving her a phone call with other information.

## 2020-05-12 NOTE — Telephone Encounter (Signed)
Patient aware that her colonoscopy was done 02-01-13 & follow up was recommended for 62yrs. She is aware she is overdue. Patient stated she would like to have it done at the same place [facility], Alger Memos, M.D.) Routing to Dr Sabra Heck.

## 2020-05-12 NOTE — Telephone Encounter (Signed)
Patient is returning call regarding colonoscopy appointment.

## 2020-05-14 ENCOUNTER — Ambulatory Visit
Admission: RE | Admit: 2020-05-14 | Discharge: 2020-05-14 | Disposition: A | Discharge status: Home / Self Care | Payer: Managed Care, Other (non HMO) | Source: Ambulatory Visit | Attending: Obstetrics & Gynecology | Admitting: Obstetrics & Gynecology

## 2020-05-14 ENCOUNTER — Other Ambulatory Visit: Payer: Self-pay

## 2020-05-14 DIAGNOSIS — R921 Mammographic calcification found on diagnostic imaging of breast: Secondary | ICD-10-CM

## 2020-05-14 HISTORY — PX: BREAST BIOPSY: SHX20

## 2020-09-25 ENCOUNTER — Inpatient Hospital Stay: Payer: Managed Care, Other (non HMO) | Attending: Family | Admitting: Family

## 2020-09-25 ENCOUNTER — Telehealth: Payer: Self-pay | Admitting: Family

## 2020-09-25 ENCOUNTER — Encounter: Payer: Self-pay | Admitting: Family

## 2020-09-25 ENCOUNTER — Inpatient Hospital Stay: Payer: Managed Care, Other (non HMO)

## 2020-09-25 ENCOUNTER — Other Ambulatory Visit: Payer: Self-pay

## 2020-09-25 VITALS — BP 135/91 | HR 76 | Temp 98.0°F | Resp 18 | Ht 61.0 in | Wt 159.1 lb

## 2020-09-25 DIAGNOSIS — Z90722 Acquired absence of ovaries, bilateral: Secondary | ICD-10-CM | POA: Insufficient documentation

## 2020-09-25 DIAGNOSIS — D509 Iron deficiency anemia, unspecified: Secondary | ICD-10-CM | POA: Insufficient documentation

## 2020-09-25 DIAGNOSIS — Z90710 Acquired absence of both cervix and uterus: Secondary | ICD-10-CM | POA: Diagnosis not present

## 2020-09-25 DIAGNOSIS — D573 Sickle-cell trait: Secondary | ICD-10-CM | POA: Diagnosis not present

## 2020-09-25 DIAGNOSIS — Z1509 Genetic susceptibility to other malignant neoplasm: Secondary | ICD-10-CM

## 2020-09-25 DIAGNOSIS — I2601 Septic pulmonary embolism with acute cor pulmonale: Secondary | ICD-10-CM

## 2020-09-25 DIAGNOSIS — I2782 Chronic pulmonary embolism: Secondary | ICD-10-CM | POA: Diagnosis not present

## 2020-09-25 DIAGNOSIS — D500 Iron deficiency anemia secondary to blood loss (chronic): Secondary | ICD-10-CM

## 2020-09-25 DIAGNOSIS — Z86711 Personal history of pulmonary embolism: Secondary | ICD-10-CM | POA: Insufficient documentation

## 2020-09-25 DIAGNOSIS — Z1501 Genetic susceptibility to malignant neoplasm of breast: Secondary | ICD-10-CM | POA: Insufficient documentation

## 2020-09-25 DIAGNOSIS — Z79899 Other long term (current) drug therapy: Secondary | ICD-10-CM | POA: Insufficient documentation

## 2020-09-25 LAB — CMP (CANCER CENTER ONLY)
ALT: 15 U/L (ref 0–44)
AST: 18 U/L (ref 15–41)
Albumin: 4.2 g/dL (ref 3.5–5.0)
Alkaline Phosphatase: 81 U/L (ref 38–126)
Anion gap: 7 (ref 5–15)
BUN: 11 mg/dL (ref 6–20)
CO2: 28 mmol/L (ref 22–32)
Calcium: 9.5 mg/dL (ref 8.9–10.3)
Chloride: 107 mmol/L (ref 98–111)
Creatinine: 0.87 mg/dL (ref 0.44–1.00)
GFR, Estimated: >60 mL/min (ref >60)
Glucose, Bld: 122 mg/dL — ABNORMAL HIGH (ref 70–99)
Potassium: 3.9 mmol/L (ref 3.5–5.1)
Sodium: 142 mmol/L (ref 135–145)
Total Bilirubin: 0.3 mg/dL (ref 0.3–1.2)
Total Protein: 7.2 g/dL (ref 6.5–8.1)

## 2020-09-25 LAB — IRON AND TIBC
Iron: 54 ug/dL (ref 41–142)
Saturation Ratios: 17 % — ABNORMAL LOW (ref 21–57)
TIBC: 313 ug/dL (ref 236–444)
UIBC: 258 ug/dL (ref 120–384)

## 2020-09-25 LAB — CBC WITH DIFFERENTIAL (CANCER CENTER ONLY)
Abs Immature Granulocytes: 0.04 10*3/uL (ref 0.00–0.07)
Basophils Absolute: 0 10*3/uL (ref 0.0–0.1)
Basophils Relative: 0 %
Eosinophils Absolute: 0.1 10*3/uL (ref 0.0–0.5)
Eosinophils Relative: 1 %
HCT: 40.7 % (ref 36.0–46.0)
Hemoglobin: 13.6 g/dL (ref 12.0–15.0)
Immature Granulocytes: 1 %
Lymphocytes Relative: 44 %
Lymphs Abs: 2.4 10*3/uL (ref 0.7–4.0)
MCH: 28.5 pg (ref 26.0–34.0)
MCHC: 33.4 g/dL (ref 30.0–36.0)
MCV: 85.1 fL (ref 80.0–100.0)
Monocytes Absolute: 0.4 10*3/uL (ref 0.1–1.0)
Monocytes Relative: 7 %
Neutro Abs: 2.6 10*3/uL (ref 1.7–7.7)
Neutrophils Relative %: 47 %
Platelet Count: 256 10*3/uL (ref 150–400)
RBC: 4.78 MIL/uL (ref 3.87–5.11)
RDW: 13.2 % (ref 11.5–15.5)
WBC Count: 5.5 10*3/uL (ref 4.0–10.5)
nRBC: 0 % (ref 0.0–0.2)

## 2020-09-25 LAB — RETICULOCYTES
Immature Retic Fract: 13.4 % (ref 2.3–15.9)
RBC.: 4.69 MIL/uL (ref 3.87–5.11)
Retic Count, Absolute: 61.9 10*3/uL (ref 19.0–186.0)
Retic Ct Pct: 1.3 % (ref 0.4–3.1)

## 2020-09-25 LAB — FERRITIN: Ferritin: 152 ng/mL (ref 11–307)

## 2020-09-25 NOTE — Progress Notes (Signed)
Hematology and Oncology Follow Up Visit  Claudia Rivas [id number] 03-24-1971 49 y.o. 09/25/2020   Principle Diagnosis:  Bilateral pulmonary emboli Chronic subpectoral lymphadenopathy Sickle Cell trait Iron deficiency anemia BRCA1 (+)  Past Therapy: Xarelto 10 mg by mouth daily maintenance therapycompleted Prophylactic bilateral salpingectomy and oophorectomy   Current Therapy:        EC ASA 867 mg po q day  Folic Acid 1 mg po q day IV iron as indicated   Interim History:  Claudia Rivas is here today for follow-up. She is doing well but notes fatigue at times.  She stays quite busy with work.  She had her mammogram last May (repeat in June as well) with biopsy of calcifications and clip placement. She is due again in 1 year.   She is ding well on 2 baby aspirin and folic acid daily.  Hgb is stable at 13.6, MCV 85.  No fever, chills, n/v, cough, rash, dizziness, SOB, chest pain, palpitations, abdominal pain or change sin bowel or bladder habits.  She hs not noted any blood loss. No abnormal bruising, no petechiae.  No swelling, tenderness, numbness or tingling in her extremities.  No falls or syncopal episodes to report.  She has a good appetite and is staying well hydrated. Her weight is stable.  She walks quite a it daily with her job.   ECOG Performance Status: 0 - Asymptomatic  Medications:  Allergies as of 09/25/2020      Reactions   Latex Rash      Medication List       Accurate as of September 25, 2020  9:04 AM. If you have any questions, ask your nurse or doctor.        acetaminophen 325 MG tablet Commonly known as: TYLENOL Take 650 mg by mouth every 6 (six) hours as needed for mild pain.   aspirin EC 81 MG tablet Take 2 tablets (162 mg total) by mouth daily.   cholecalciferol 1000 units tablet Commonly known as: VITAMIN D Take 1,000 Units by mouth daily.   folic acid 1 MG tablet Commonly known as: FOLVITE Take 1 tablet (1 mg total) by  mouth daily.   glucosamine-chondroitin 500-400 MG tablet Take 1 tablet by mouth 2 (two) times daily.   multivitamin capsule Take 1 capsule by mouth daily.       Allergies:  Allergies  Allergen Reactions  . Latex Rash    Past Medical History, Surgical history, Social history, and Family History were reviewed and updated.  Review of Systems: All other 10 point review of systems is negative.   Physical Exam:  vitals were not taken for this visit.   Wt Readings from Last 3 Encounters:  04/06/20 161 lb 6.4 oz (73.2 kg)  03/27/20 161 lb (73 kg)  03/24/20 162 lb 9.6 oz (73.8 kg)    Ocular: Sclerae unicteric, pupils equal, round and reactive to light Ear-nose-throat: Oropharynx clear, dentition fair Lymphatic: No cervical, supraclavicular or axillary adenopathy Lungs no rales or rhonchi, good excursion bilaterally Heart regular rate and rhythm, no murmur appreciated Abd soft, nontender, positive bowel sounds, no liver or spleen tip palpated on exam, no fluid wave  MSK no focal spinal tenderness, no joint edema Neuro: non-focal, well-oriented, appropriate affect Breasts: Deferred   Lab Results  Component Value Date   WBC 5.5 09/25/2020   HGB 13.6 09/25/2020   HCT 40.7 09/25/2020   MCV 85.1 09/25/2020   PLT 256 09/25/2020   Lab Results  Component Value Date  FERRITIN 135 03/27/2020   IRON 100 03/27/2020   TIBC 304 03/27/2020   UIBC 204 03/27/2020   IRONPCTSAT 33 03/27/2020   Lab Results  Component Value Date   RETICCTPCT 1.3 09/25/2020   RBC 4.69 09/25/2020   RBC 4.78 09/25/2020   No results found for: KPAFRELGTCHN, LAMBDASER, KAPLAMBRATIO No results found for: IGGSERUM, IGA, IGMSERUM No results found for: Odetta Pink, SPEI   Chemistry      Component Value Date/Time   NA 139 03/27/2020 0839   NA 145 (H) 03/24/2020 1152   NA 143 12/11/2017 1011   K 4.1 03/27/2020 0839   K 4.0 12/11/2017 1011   CL  106 03/27/2020 0839   CL 111 (H) 10/11/2017 0847   CO2 26 03/27/2020 0839   CO2 25 12/11/2017 1011   BUN 10 03/27/2020 0839   BUN 10 03/24/2020 1152   BUN 11.2 12/11/2017 1011   CREATININE 0.78 03/27/2020 0839   CREATININE 0.9 12/11/2017 1011      Component Value Date/Time   CALCIUM 9.2 03/27/2020 0839   CALCIUM 9.4 12/11/2017 1011   ALKPHOS 72 03/27/2020 0839   ALKPHOS 70 12/11/2017 1011   AST 23 03/27/2020 0839   AST 15 12/11/2017 1011   ALT 22 03/27/2020 0839   ALT 14 12/11/2017 1011   BILITOT 0.5 03/27/2020 0839   BILITOT 0.43 12/11/2017 1011       Impression and Plan: Claudia Rivas is a very pleasant 49 yo African American female with history of PE, sickle cell trait, iron deficiency anemia and BRCA1 (+).  She will continue her same regimen with 2 baby aspirin and folic acid daily.  Iron studies are pending. We can replace if needed.  Follow-up in 6 months.  She can contact our office with any questions or concerns.   Laverna Peace, NP 10/15/20219:04 AM

## 2020-09-25 NOTE — Telephone Encounter (Signed)
Appointments scheduled calendar printed per 10/15 los °

## 2020-09-29 ENCOUNTER — Other Ambulatory Visit: Payer: Self-pay | Admitting: Family

## 2020-10-02 ENCOUNTER — Inpatient Hospital Stay: Payer: Managed Care, Other (non HMO)

## 2020-10-02 ENCOUNTER — Other Ambulatory Visit: Payer: Self-pay

## 2020-10-02 VITALS — BP 125/78 | HR 76 | Temp 98.4°F | Resp 19

## 2020-10-02 DIAGNOSIS — D500 Iron deficiency anemia secondary to blood loss (chronic): Secondary | ICD-10-CM

## 2020-10-02 DIAGNOSIS — Z86711 Personal history of pulmonary embolism: Secondary | ICD-10-CM | POA: Diagnosis not present

## 2020-10-02 MED ORDER — SODIUM CHLORIDE 0.9 % IV SOLN
200.0000 mg | Freq: Once | INTRAVENOUS | Status: AC
  Administered 2020-10-02: 200 mg via INTRAVENOUS
  Filled 2020-10-02: qty 200

## 2020-10-02 MED ORDER — SODIUM CHLORIDE 0.9 % IV SOLN
Freq: Once | INTRAVENOUS | Status: AC
  Filled 2020-10-02: qty 250

## 2020-10-02 NOTE — Patient Instructions (Signed)

## 2020-10-05 ENCOUNTER — Ambulatory Visit: Payer: Managed Care, Other (non HMO)

## 2020-10-08 ENCOUNTER — Inpatient Hospital Stay: Payer: Managed Care, Other (non HMO)

## 2020-10-08 ENCOUNTER — Other Ambulatory Visit: Payer: Self-pay

## 2020-10-08 VITALS — BP 133/84 | HR 60 | Temp 98.6°F | Resp 18

## 2020-10-08 DIAGNOSIS — D500 Iron deficiency anemia secondary to blood loss (chronic): Secondary | ICD-10-CM

## 2020-10-08 DIAGNOSIS — Z86711 Personal history of pulmonary embolism: Secondary | ICD-10-CM | POA: Diagnosis not present

## 2020-10-08 MED ORDER — SODIUM CHLORIDE 0.9 % IV SOLN
200.0000 mg | Freq: Once | INTRAVENOUS | Status: AC
  Administered 2020-10-08: 200 mg via INTRAVENOUS
  Filled 2020-10-08: qty 200

## 2020-10-08 MED ORDER — SODIUM CHLORIDE 0.9 % IV SOLN
Freq: Once | INTRAVENOUS | Status: AC
  Filled 2020-10-08: qty 250

## 2020-10-08 NOTE — Patient Instructions (Signed)

## 2020-10-12 ENCOUNTER — Inpatient Hospital Stay: Payer: Managed Care, Other (non HMO)

## 2020-10-15 ENCOUNTER — Inpatient Hospital Stay: Payer: Managed Care, Other (non HMO) | Attending: Family

## 2020-10-15 ENCOUNTER — Other Ambulatory Visit: Payer: Self-pay

## 2020-10-15 VITALS — BP 127/84 | HR 71 | Temp 97.9°F | Resp 17

## 2020-10-15 DIAGNOSIS — D500 Iron deficiency anemia secondary to blood loss (chronic): Secondary | ICD-10-CM | POA: Insufficient documentation

## 2020-10-15 DIAGNOSIS — Z79899 Other long term (current) drug therapy: Secondary | ICD-10-CM | POA: Diagnosis not present

## 2020-10-15 MED ORDER — SODIUM CHLORIDE 0.9 % IV SOLN
Freq: Once | INTRAVENOUS | Status: AC
  Filled 2020-10-15: qty 250

## 2020-10-15 MED ORDER — SODIUM CHLORIDE 0.9 % IV SOLN
200.0000 mg | Freq: Once | INTRAVENOUS | Status: AC
  Administered 2020-10-15: 200 mg via INTRAVENOUS
  Filled 2020-10-15: qty 200

## 2020-10-15 NOTE — Progress Notes (Signed)
Pt discharged in no apparent distress. Pt left ambulatory without assistance. Pt aware of discharge instructions and verbalized understanding and had no further questions.  

## 2020-12-13 ENCOUNTER — Emergency Department (HOSPITAL_BASED_OUTPATIENT_CLINIC_OR_DEPARTMENT_OTHER): Payer: Managed Care, Other (non HMO)

## 2020-12-13 ENCOUNTER — Other Ambulatory Visit: Payer: Self-pay

## 2020-12-13 ENCOUNTER — Encounter (HOSPITAL_BASED_OUTPATIENT_CLINIC_OR_DEPARTMENT_OTHER): Payer: Self-pay

## 2020-12-13 ENCOUNTER — Emergency Department (HOSPITAL_BASED_OUTPATIENT_CLINIC_OR_DEPARTMENT_OTHER)
Admission: EM | Admit: 2020-12-13 | Discharge: 2020-12-13 | Disposition: A | Discharge status: Home / Self Care | Payer: Managed Care, Other (non HMO) | Attending: Emergency Medicine | Admitting: Emergency Medicine

## 2020-12-13 DIAGNOSIS — Z7982 Long term (current) use of aspirin: Secondary | ICD-10-CM | POA: Diagnosis not present

## 2020-12-13 DIAGNOSIS — Z20822 Contact with and (suspected) exposure to covid-19: Secondary | ICD-10-CM

## 2020-12-13 DIAGNOSIS — B349 Viral infection, unspecified: Secondary | ICD-10-CM | POA: Insufficient documentation

## 2020-12-13 DIAGNOSIS — Z91040 Latex allergy status: Secondary | ICD-10-CM | POA: Insufficient documentation

## 2020-12-13 DIAGNOSIS — R059 Cough, unspecified: Secondary | ICD-10-CM | POA: Diagnosis present

## 2020-12-13 MED ORDER — BENZONATATE 100 MG PO CAPS: 100.0000 mg | ORAL_CAPSULE | Freq: Three times a day (TID) | ORAL | 0 refills | Status: DC

## 2020-12-13 MED ORDER — ALBUTEROL SULFATE HFA 108 (90 BASE) MCG/ACT IN AERS
2.0000 | INHALATION_SPRAY | Freq: Once | RESPIRATORY_TRACT | Status: AC
  Administered 2020-12-13: 2 via RESPIRATORY_TRACT
  Filled 2020-12-13: qty 6.7

## 2020-12-13 NOTE — Discharge Instructions (Signed)
Your chest xray did not show any signs of pneumonia at this time. Please await your COVID test results from yesterday. If positive you will need to self isolate.   Use albuterol inhaler (2 puffs every 4 hours) as needed for chest tightness. I have also prescribed cough medication for you to take as prescribed.   Continue monitoring your symptoms at home. Drink plenty of fluids to stay hydrated. Take Tylenol as needed for fevers > 100.4.   Return to the ED IMMEDIATELY for any worsening symptoms including severe chest pain, shortness of breath, lips/fingers turning blue, inability to awaken easily, new confusion, or any other new/worsening symptoms.

## 2020-12-13 NOTE — ED Triage Notes (Signed)
Complains of headache since Friday. Fever, productive cough. COVID Vaccinated.

## 2020-12-13 NOTE — ED Provider Notes (Signed)
[facility] Provider Note   CSN: [id number] Arrival date & time: 12/13/20  2505     History Chief Complaint  Patient presents with  . Cough  . Headache    Claudia Rivas is a 50 y.o. female who presents to the ED today with complaint of fevers Tmax 101.6 x 3 days. Pt also complains of chills, cough, headache, body aches, and chest tightness. She reports that she is vaccinated x 2 without booster - last dose in March. 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 Pt went to [facility] yesterday to get tested with her husband - his test has returned negative however she still hasn't heard about her test yet. She has been taking Tylenol as needed for fevers. Last took Tylenol last night before bed. Pt reports history of pneumonia 10 years ago and states this feels similar. Denies shortness of breath, abdominal pain, nausea, vomiting, or any other associated symptoms.   The history is provided by the patient and medical records.       Past Medical History:  Diagnosis Date  . BRCA1 gene mutation positive   . Diverticulitis   . Family history of BRCA1 gene positive   . Family history of breast cancer   . Fibroids   . GERD (gastroesophageal reflux disease)    diet controlled - no meds  . Iron deficiency anemia    received iron transfusion 08/17/2016   . Plantar fasciitis    right foot  . Pneumonia 08/2009   one time only 2010  . Pulmonary embolism ([place]) 08/16/2016  . Sickle cell trait ([place])   . SVD (spontaneous vaginal delivery)    x 3    Patient Active Problem List   Diagnosis Date Noted  . Fibroids 06/22/2018  . Genetic testing 12/19/2017  . BRCA1 gene mutation positive 12/19/2017  . Family history of breast cancer   . Family history of BRCA1 gene positive   . Sickle cell trait ([place]) 09/15/2016  . Displacement of intrauterine contraceptive device 08/31/2016  . Microcytic anemia 08/17/2016  .  Hypokalemia 08/17/2016  . Iron deficiency anemia 08/17/2016  . GERD (gastroesophageal reflux disease)   . Pulmonary embolism with acute cor pulmonale ([place]) 08/16/2016  . Hemorrhoids 12/02/2015  . Adjustment disorder with depressed mood 11/28/2015  . 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 Benign neoplasm of colon 11/28/2015    Past Surgical History:  Procedure Laterality Date  . BREAST BIOPSY Right   . COLONOSCOPY     polyps  . DILATATION & CURETTAGE/HYSTEROSCOPY WITH MYOSURE N/A 11/26/2018   Procedure: DILATATION & CURETTAGE/HYSTEROSCOPY;  Surgeon: Megan Salon, MD;  Location: [facility];  Service: Gynecology;  Laterality: N/A;  . LAPAROSCOPIC BILATERAL SALPINGECTOMY Bilateral 11/26/2018   Procedure: LAPAROSCOPIC BILATERAL SALPINGECTOMY OOPHORECTOMY;  Surgeon: Megan Salon, MD;  Location: [facility];  Service: Gynecology;  Laterality: Bilateral;  LATEX ALLERGY  . WISDOM TOOTH EXTRACTION  2001     OB History    Gravida  3   Para  3   Term  3   Preterm      AB      Living  3     SAB      IAB      Ectopic      Multiple      Live Births  3           Family History  Problem Relation Age of Onset  .  Crohn's disease Mother   . Breast cancer Mother 28       recurrance at 38, died at 43  . BRCA 1/2 Mother   . Crohn's disease Sister   . Diverticulitis Brother   . Breast cancer Daughter 27  . BRCA 1/2 Daughter        BRCA1 [id number] (Splice donor)  . Breast cancer Maternal Aunt 46  . BRCA 1/2 Maternal Aunt   . Breast cancer Maternal Aunt 53       dx stage IV, lived 3 more weeks  . Heart attack Maternal Grandmother   . Cervical cancer Sister 20       pat 1/2 sister, died at 22    Social History   Tobacco Use  . Smoking status: Never Smoker  . Smokeless tobacco: Never Used  Vaping Use  . Vaping Use: Never used  Substance Use Topics  . Alcohol use: Yes    Comment: ocassional wine  . Drug use: No    Home Medications Prior to Admission medications    Medication Sig Start Date End Date Taking? Authorizing Provider  benzonatate (TESSALON) 100 MG capsule Take 1 capsule (100 mg total) by mouth every 8 (eight) hours. 12/13/20  Yes Venter, Margaux, PA-C  acetaminophen (TYLENOL) 325 MG tablet Take 650 mg by mouth every 6 (six) hours as needed for mild pain.    [provider]  aspirin EC 81 MG tablet Take 2 tablets (162 mg total) by mouth daily. 08/29/18   Ennever, Peter R, MD  cholecalciferol (VITAMIN D) 1000 units tablet Take 1,000 Units by mouth daily.    [provider]  folic acid (FOLVITE) 1 MG tablet Take 1 tablet (1 mg total) by mouth daily. 12/02/16   Ennever, Peter R, MD  glucosamine-chondroitin 500-400 MG tablet Take 1 tablet by mouth 2 (two) times daily.    [provider]  Multiple Vitamin (MULTIVITAMIN) capsule Take 1 capsule by mouth daily.    [provider]    Allergies    Latex  Review of Systems   Review of Systems  Constitutional: Positive for chills and fever.  Respiratory: Positive for cough and chest tightness. Negative for shortness of breath.   Musculoskeletal: Positive for myalgias.  Neurological: Positive for headaches.  All other systems reviewed and are negative.   Physical Exam Updated Vital Signs BP (!) 158/104 (BP Location: Left Arm)   Pulse 98   Temp 98.5 F (36.9 C) (Oral)   Resp 18   Ht 5' 1" (1.549 m)   Wt 72.1 kg   SpO2 99%   BMI 30.04 kg/m   Physical Exam Vitals and nursing note reviewed.  Constitutional:      Appearance: She is not ill-appearing or diaphoretic.  HENT:     Head: Normocephalic and atraumatic.  Eyes:     Conjunctiva/sclera: Conjunctivae normal.  Cardiovascular:     Rate and Rhythm: Normal rate and regular rhythm.     Heart sounds: Normal heart sounds.  Pulmonary:     Effort: Pulmonary effort is normal.     Breath sounds: Normal breath sounds. No wheezing, rhonchi or rales.     Comments: Speaking in full sentences without difficulty.  Satting 99% on RA. LCTAB.  Abdominal:     Palpations: Abdomen is soft.     Tenderness: There is no abdominal tenderness.  Musculoskeletal:     Cervical back: Neck supple.  Skin:    General: Skin is warm and dry.  Neurological:       Mental Status: She is alert.     ED Results / Procedures / Treatments   Labs (all labs ordered are listed, but only abnormal results are displayed) Labs Reviewed - No data to display  EKG None  Radiology DG Chest Portable 1 View  Result Date: 12/13/2020 CLINICAL DATA:  Cough, fever and congestion. EXAM: PORTABLE CHEST 1 VIEW COMPARISON:  Chest CT 08/22/2017 FINDINGS: The cardiac silhouette, mediastinal and hilar contours are within normal limits. The lungs are clear. No pleural effusions. No pulmonary lesions. The bony thorax is intact. IMPRESSION: No acute cardiopulmonary findings. Electronically Signed   By: Marijo Sanes M.D.   On: 12/13/2020 13:15    Procedures Procedures (including critical care time)  Medications Ordered in ED Medications  albuterol (VENTOLIN HFA) 108 (90 Base) MCG/ACT inhaler 2 puff (has no administration in time range)    ED Course  I have reviewed the triage vital signs and the nursing notes.  Pertinent labs & imaging results that were available during my care of the patient were reviewed by me and considered in my medical decision making (see chart for details).    MDM Rules/Calculators/A&P                          50 year old female presents to the ED today with complaint of fevers, chills, fatigue, body aches, headache, cough, chest tightness for the past 3 days.  Vaccinated against Covid x2, last dose in March without booster.  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  Currently awaiting her test from [facility], her husband went with her and got tested as well.  His test has returned negative however she is still awaiting her results.  She presented to the ED as she has a history of pneumonia 10  years ago and states this feels similar.  On arrival to the ED vitals are stable.  Patient is afebrile at 98.5, nontachycardic, nontachypneic.  She appears to be in no acute distress.  She is speaking in full sentences without difficulty and satting 99% on room air.  Her lungs are clear to auscultation bilaterally.  Given her history of pneumonia will obtain a chest x-ray, if negative we'll plan to discharge home and have patient await her Covid results.  There is a high likelihood given she was exposed with an outbreak and has not had a booster.  Will provide albuterol inhaler given complaint of chest tightness however patient doesn't have any increased work of breathing at this time.  Also provide Tessalon Perles for symptomatic relief on discharge.  Feel patient needs additional Covid testing at this time, she will not be admitted and does not need to our test, is awaiting her Covid test results from [facility] that is PCR.   X-ray clear.  Will discharge patient home at this time with albuterol inhaler and Tessalon Perles with instructions to self isolate until she receives her Covid results.  She is instructed to return to the ED for any worsening symptoms including shortness of breath, severe chest pain, finger/lips turning blue, inability to awaken easily, new onset confusion, any other new/worsening symptoms.  Patient is in agreement with plan and stable for discharge.   This note was prepared using Dragon voice recognition software and may include unintentional dictation errors due to the inherent limitations of voice recognition software.  Claudia Rivas was evaluated in Emergency Department on 12/13/2020 for the symptoms described in the history of present  illness. She was evaluated in the context of the global COVID-19 pandemic, which necessitated consideration that the patient might be at risk for infection with the SARS-CoV-2 virus that causes COVID-19. Institutional protocols and algorithms  that pertain to the evaluation of patients at risk for COVID-19 are in a state of rapid change based on information released by regulatory bodies including the CDC and federal and state organizations. These policies and algorithms were followed during the patient's care in the ED.  Final Clinical Impression(s) / ED Diagnoses Final diagnoses:  Viral illness  Person under investigation for COVID-19    Rx / DC Orders ED Discharge Orders         Ordered    benzonatate (TESSALON) 100 MG capsule  Every 8 hours        12/13/20 1331           Discharge Instructions     Your chest xray did not show any signs of pneumonia at this time. Please await your COVID test results from yesterday. If positive you will need to self isolate.   Use albuterol inhaler (2 puffs every 4 hours) as needed for chest tightness. I have also prescribed cough medication for you to take as prescribed.   Continue monitoring your symptoms at home. Drink plenty of fluids to stay hydrated. Take Tylenol as needed for fevers > 100.4.   Return to the ED IMMEDIATELY for any worsening symptoms including severe chest pain, shortness of breath, lips/fingers turning blue, inability to awaken easily, new confusion, or any other new/worsening symptoms.        Eustaquio Maize, PA-C 12/13/20 1331    Malvin Johns, MD 12/13/20 1420

## 2020-12-13 NOTE — ED Notes (Addendum)
SINCE Friday HAS HAD A H/A and fever  Has had both vaccines , has had a cough and a lot of congestion states feel like it did when she had pneumonia had covid test yesterday  She and her husband , his was neg

## 2021-03-26 ENCOUNTER — Telehealth: Payer: Self-pay | Admitting: *Deleted

## 2021-03-26 ENCOUNTER — Inpatient Hospital Stay: Payer: Managed Care, Other (non HMO) | Admitting: Family

## 2021-03-26 ENCOUNTER — Inpatient Hospital Stay: Payer: Managed Care, Other (non HMO) | Attending: Family

## 2021-03-26 ENCOUNTER — Other Ambulatory Visit: Payer: Self-pay

## 2021-03-26 ENCOUNTER — Encounter: Payer: Self-pay | Admitting: Family

## 2021-03-26 VITALS — BP 122/86 | HR 77 | Temp 98.2°F | Resp 16 | Wt 157.0 lb

## 2021-03-26 DIAGNOSIS — D500 Iron deficiency anemia secondary to blood loss (chronic): Secondary | ICD-10-CM

## 2021-03-26 DIAGNOSIS — D509 Iron deficiency anemia, unspecified: Secondary | ICD-10-CM | POA: Diagnosis not present

## 2021-03-26 DIAGNOSIS — Z86711 Personal history of pulmonary embolism: Secondary | ICD-10-CM | POA: Diagnosis present

## 2021-03-26 DIAGNOSIS — Z7901 Long term (current) use of anticoagulants: Secondary | ICD-10-CM | POA: Insufficient documentation

## 2021-03-26 DIAGNOSIS — D573 Sickle-cell trait: Secondary | ICD-10-CM | POA: Insufficient documentation

## 2021-03-26 DIAGNOSIS — Z7982 Long term (current) use of aspirin: Secondary | ICD-10-CM | POA: Insufficient documentation

## 2021-03-26 DIAGNOSIS — I2782 Chronic pulmonary embolism: Secondary | ICD-10-CM | POA: Diagnosis not present

## 2021-03-26 DIAGNOSIS — Z1509 Genetic susceptibility to other malignant neoplasm: Secondary | ICD-10-CM

## 2021-03-26 DIAGNOSIS — R591 Generalized enlarged lymph nodes: Secondary | ICD-10-CM | POA: Diagnosis not present

## 2021-03-26 DIAGNOSIS — R202 Paresthesia of skin: Secondary | ICD-10-CM | POA: Insufficient documentation

## 2021-03-26 DIAGNOSIS — I2601 Septic pulmonary embolism with acute cor pulmonale: Secondary | ICD-10-CM

## 2021-03-26 DIAGNOSIS — Z1501 Genetic susceptibility to malignant neoplasm of breast: Secondary | ICD-10-CM

## 2021-03-26 DIAGNOSIS — Z90722 Acquired absence of ovaries, bilateral: Secondary | ICD-10-CM | POA: Insufficient documentation

## 2021-03-26 DIAGNOSIS — Z90710 Acquired absence of both cervix and uterus: Secondary | ICD-10-CM | POA: Insufficient documentation

## 2021-03-26 LAB — IRON AND TIBC
Iron: 122 ug/dL (ref 41–142)
Saturation Ratios: 41 % (ref 21–57)
TIBC: 296 ug/dL (ref 236–444)
UIBC: 175 ug/dL (ref 120–384)

## 2021-03-26 LAB — CBC WITH DIFFERENTIAL (CANCER CENTER ONLY)
Abs Immature Granulocytes: 0.02 10*3/uL (ref 0.00–0.07)
Basophils Absolute: 0 10*3/uL (ref 0.0–0.1)
Basophils Relative: 0 %
Eosinophils Absolute: 0.1 10*3/uL (ref 0.0–0.5)
Eosinophils Relative: 2 %
HCT: 38.1 % (ref 36.0–46.0)
Hemoglobin: 13.1 g/dL (ref 12.0–15.0)
Immature Granulocytes: 0 %
Lymphocytes Relative: 47 %
Lymphs Abs: 2.5 10*3/uL (ref 0.7–4.0)
MCH: 29.3 pg (ref 26.0–34.0)
MCHC: 34.4 g/dL (ref 30.0–36.0)
MCV: 85.2 fL (ref 80.0–100.0)
Monocytes Absolute: 0.3 10*3/uL (ref 0.1–1.0)
Monocytes Relative: 5 %
Neutro Abs: 2.6 10*3/uL (ref 1.7–7.7)
Neutrophils Relative %: 46 %
Platelet Count: 279 10*3/uL (ref 150–400)
RBC: 4.47 MIL/uL (ref 3.87–5.11)
RDW: 13.5 % (ref 11.5–15.5)
WBC Count: 5.5 10*3/uL (ref 4.0–10.5)
nRBC: 0 % (ref 0.0–0.2)

## 2021-03-26 LAB — CMP (CANCER CENTER ONLY)
ALT: 13 U/L (ref 0–44)
AST: 17 U/L (ref 15–41)
Albumin: 4.2 g/dL (ref 3.5–5.0)
Alkaline Phosphatase: 67 U/L (ref 38–126)
Anion gap: 7 (ref 5–15)
BUN: 11 mg/dL (ref 6–20)
CO2: 29 mmol/L (ref 22–32)
Calcium: 9.4 mg/dL (ref 8.9–10.3)
Chloride: 106 mmol/L (ref 98–111)
Creatinine: 0.82 mg/dL (ref 0.44–1.00)
GFR, Estimated: >60 mL/min (ref >60)
Glucose, Bld: 126 mg/dL — ABNORMAL HIGH (ref 70–99)
Potassium: 3.9 mmol/L (ref 3.5–5.1)
Sodium: 142 mmol/L (ref 135–145)
Total Bilirubin: 0.5 mg/dL (ref 0.3–1.2)
Total Protein: 7.3 g/dL (ref 6.5–8.1)

## 2021-03-26 LAB — RETICULOCYTES
Immature Retic Fract: 17.1 % — ABNORMAL HIGH (ref 2.3–15.9)
RBC.: 4.5 MIL/uL (ref 3.87–5.11)
Retic Count, Absolute: 77.4 10*3/uL (ref 19.0–186.0)
Retic Ct Pct: 1.7 % (ref 0.4–3.1)

## 2021-03-26 LAB — FERRITIN: Ferritin: 392 ng/mL — ABNORMAL HIGH (ref 11–307)

## 2021-03-26 NOTE — Progress Notes (Signed)
Hematology and Oncology Follow Up Visit  Claudia Rivas [id number] 13-May-1971 50 y.o. 03/26/2021   Principle Diagnosis:  Bilateral pulmonary emboli Chronic subpectoral lymphadenopathy Sickle Cell trait Iron deficiency anemia BRCA1 (+)  Past Therapy: Xarelto 10 mg by mouth daily maintenance therapycompleted Prophylactic bilateral salpingectomy and oophorectomy   Current Therapy: EC ASA 563 mg po q day  Folic Acid 1 mg po q day IV iron as indicated   Interim History:  Claudia Rivas is here today for follow-up. She is doing well and excited for her new grandbaby that is due later this month.  She is doing well on 1 baby aspirin and takes her folic acid off and on.  No blood loss noted. No bruising or petechiae.  No fever, chills, n/v, cough, rash, dizziness, SOB, chest pain, palpitations, abdominal pain or changes in bowel or bladder habits.  She has occasional swelling in her hands in the morning is she ate tomatoes.  She notes tingling in her right middle toe at times.  No falls or syncope.  She has maintained a good appetite and is staying well hydrated. Her weight is stable.  She is staying busy with work.   ECOG Performance Status: 0 - Asymptomatic  Medications:  Allergies as of 03/26/2021      Reactions   Latex Rash      Medication List       Accurate as of March 26, 2021  8:51 AM. If you have any questions, ask your nurse or doctor.        STOP taking these medications   benzonatate 100 MG capsule Commonly known as: TESSALON Stopped by: Laverna Peace, NP     TAKE these medications   acetaminophen 325 MG tablet Commonly known as: TYLENOL Take 650 mg by mouth every 6 (six) hours as needed for mild pain.   aspirin EC 81 MG tablet Take 2 tablets (162 mg total) by mouth daily.   cholecalciferol 1000 units tablet Commonly known as: VITAMIN D Take 1,000 Units by mouth daily.   folic acid 1 MG tablet Commonly known as: FOLVITE Take  1 tablet (1 mg total) by mouth daily.   glucosamine-chondroitin 500-400 MG tablet Take 1 tablet by mouth 2 (two) times daily.   MELATONIN PO Place 10 mg under the tongue at bedtime as needed.   multivitamin capsule Take 1 capsule by mouth daily.       Allergies:  Allergies  Allergen Reactions  . Latex Rash    Past Medical History, Surgical history, Social history, and Family History were reviewed and updated.  Review of Systems: All other 10 point review of systems is negative.   Physical Exam:  weight is 157 lb (71.2 kg). Her oral temperature is 98.2 F (36.8 C). Her blood pressure is 122/86 and her pulse is 77. Her respiration is 16 and oxygen saturation is 100%.   Wt Readings from Last 3 Encounters:  03/26/21 157 lb (71.2 kg)  12/13/20 159 lb (72.1 kg)  09/25/20 159 lb 1.9 oz (72.2 kg)    Ocular: Sclerae unicteric, pupils equal, round and reactive to light Ear-nose-throat: Oropharynx clear, dentition fair Lymphatic: No cervical or supraclavicular adenopathy Lungs no rales or rhonchi, good excursion bilaterally Heart regular rate and rhythm, no murmur appreciated Abd soft, nontender, positive bowel sounds MSK no focal spinal tenderness, no joint edema Neuro: non-focal, well-oriented, appropriate affect Breasts: Deferred   Lab Results  Component Value Date   WBC 5.5 03/26/2021   HGB 13.1 03/26/2021  HCT 38.1 03/26/2021   MCV 85.2 03/26/2021   PLT 279 03/26/2021   Lab Results  Component Value Date   FERRITIN 152 09/25/2020   IRON 54 09/25/2020   TIBC 313 09/25/2020   UIBC 258 09/25/2020   IRONPCTSAT 17 (L) 09/25/2020   Lab Results  Component Value Date   RETICCTPCT 1.7 03/26/2021   RBC 4.50 03/26/2021   RBC 4.47 03/26/2021   No results found for: KPAFRELGTCHN, LAMBDASER, KAPLAMBRATIO No results found for: Kandis Cocking, IGMSERUM No results found for: Odetta Pink, SPEI   Chemistry       Component Value Date/Time   NA 142 09/25/2020 0838   NA 145 (H) 03/24/2020 1152   NA 143 12/11/2017 1011   K 3.9 09/25/2020 0838   K 4.0 12/11/2017 1011   CL 107 09/25/2020 0838   CL 111 (H) 10/11/2017 0847   CO2 28 09/25/2020 0838   CO2 25 12/11/2017 1011   BUN 11 09/25/2020 0838   BUN 10 03/24/2020 1152   BUN 11.2 12/11/2017 1011   CREATININE 0.87 09/25/2020 0838   CREATININE 0.9 12/11/2017 1011      Component Value Date/Time   CALCIUM 9.5 09/25/2020 0838   CALCIUM 9.4 12/11/2017 1011   ALKPHOS 81 09/25/2020 0838   ALKPHOS 70 12/11/2017 1011   AST 18 09/25/2020 0838   AST 15 12/11/2017 1011   ALT 15 09/25/2020 0838   ALT 14 12/11/2017 1011   BILITOT 0.3 09/25/2020 0838   BILITOT 0.43 12/11/2017 1011       Impression and Plan: Claudia Rivas is a very pleasant49yo African American female with history of PE, sickle cell trait, iron deficiency anemia and BRCA1 (+).  She will continue taking her 2 baby aspirin and restart her folic acid daily.  Iron studies are pending.  Follow-up in 6 months.  She can contact our office with any questions or concerns.   Laverna Peace, NP 4/15/20228:51 AM

## 2021-03-26 NOTE — Telephone Encounter (Signed)
Per 03/26/21 los gave patient upcoming appointments - gave calendar

## 2021-04-19 ENCOUNTER — Ambulatory Visit: Payer: Managed Care, Other (non HMO)

## 2021-06-03 ENCOUNTER — Telehealth: Payer: Self-pay | Admitting: *Deleted

## 2021-06-03 NOTE — Telephone Encounter (Signed)
Called patient about rescheduling appointment from 09/24/21 to 09/27/21 with Judson Roch - patient confirmed - mailed new calendar

## 2021-06-15 ENCOUNTER — Ambulatory Visit
Admission: RE | Admit: 2021-06-15 | Discharge: 2021-06-15 | Disposition: A | Discharge status: Home / Self Care | Payer: Managed Care, Other (non HMO) | Source: Ambulatory Visit | Attending: Obstetrics & Gynecology | Admitting: Obstetrics & Gynecology

## 2021-06-15 ENCOUNTER — Other Ambulatory Visit: Payer: Self-pay

## 2021-06-15 ENCOUNTER — Other Ambulatory Visit: Payer: Self-pay | Admitting: Obstetrics & Gynecology

## 2021-06-15 DIAGNOSIS — Z1231 Encounter for screening mammogram for malignant neoplasm of breast: Secondary | ICD-10-CM

## 2021-06-16 ENCOUNTER — Telehealth: Payer: Self-pay

## 2021-06-16 ENCOUNTER — Other Ambulatory Visit: Payer: Self-pay

## 2021-06-16 DIAGNOSIS — I2601 Septic pulmonary embolism with acute cor pulmonale: Secondary | ICD-10-CM

## 2021-06-16 MED ORDER — RIVAROXABAN 2.5 MG PO TABS: 5.0000 mg | ORAL_TABLET | Freq: Every day | ORAL | 0 refills | Status: DC

## 2021-06-16 NOTE — Telephone Encounter (Signed)
Received call from pt reporting she is taking a direct flight to [place] soon and questioning if Dr Marin Olp has any recommendations for her 5 hour flight besides compression stockings and walking frequently. Pt has hx of PE and is currently taking ASA 182mg .   Per Dr Marin Olp, along with the those recommendations, pt to take Xarelto 5mg  beginning the day before the flight throughout the trip until the day after returning. Pt verbalizes understanding using teachback. Patient will be gone for 8 days. 10 day supply to pt's preferred pharmacy.

## 2021-06-18 ENCOUNTER — Other Ambulatory Visit: Payer: Self-pay | Admitting: *Deleted

## 2021-06-18 DIAGNOSIS — I2782 Chronic pulmonary embolism: Secondary | ICD-10-CM

## 2021-06-18 DIAGNOSIS — I2601 Septic pulmonary embolism with acute cor pulmonale: Secondary | ICD-10-CM

## 2021-06-18 MED ORDER — XARELTO 2.5 MG PO TABS: 5.0000 mg | ORAL_TABLET | Freq: Every day | ORAL | 0 refills | Status: DC

## 2021-07-05 ENCOUNTER — Other Ambulatory Visit: Payer: Self-pay | Admitting: *Deleted

## 2021-07-05 DIAGNOSIS — I2782 Chronic pulmonary embolism: Secondary | ICD-10-CM

## 2021-07-05 DIAGNOSIS — I2601 Septic pulmonary embolism with acute cor pulmonale: Secondary | ICD-10-CM

## 2021-07-05 MED ORDER — XARELTO 2.5 MG PO TABS: 5.0000 mg | ORAL_TABLET | Freq: Every day | ORAL | 0 refills | Status: DC

## 2021-09-24 ENCOUNTER — Other Ambulatory Visit: Payer: Managed Care, Other (non HMO)

## 2021-09-24 ENCOUNTER — Ambulatory Visit: Payer: Managed Care, Other (non HMO) | Admitting: Family

## 2021-09-27 ENCOUNTER — Inpatient Hospital Stay: Payer: Managed Care, Other (non HMO) | Admitting: Family

## 2021-09-27 ENCOUNTER — Inpatient Hospital Stay: Payer: Managed Care, Other (non HMO) | Attending: Hematology & Oncology

## 2022-10-10 ENCOUNTER — Other Ambulatory Visit: Payer: Self-pay | Admitting: Obstetrics & Gynecology

## 2022-10-10 DIAGNOSIS — Z1231 Encounter for screening mammogram for malignant neoplasm of breast: Secondary | ICD-10-CM

## 2022-12-19 ENCOUNTER — Ambulatory Visit
Admission: RE | Admit: 2022-12-19 | Discharge: 2022-12-19 | Disposition: A | Discharge status: Home / Self Care | Payer: Managed Care, Other (non HMO) | Source: Ambulatory Visit | Attending: Obstetrics & Gynecology | Admitting: Obstetrics & Gynecology

## 2022-12-19 DIAGNOSIS — Z1231 Encounter for screening mammogram for malignant neoplasm of breast: Secondary | ICD-10-CM

## 2023-02-02 ENCOUNTER — Ambulatory Visit: Payer: Managed Care, Other (non HMO) | Admitting: Internal Medicine

## 2023-02-02 ENCOUNTER — Encounter: Payer: Self-pay | Admitting: Family

## 2023-02-02 VITALS — BP 122/78 | HR 87 | Temp 97.0°F | Resp 18 | Ht 61.0 in | Wt 155.4 lb

## 2023-02-02 DIAGNOSIS — G4483 Primary cough headache: Secondary | ICD-10-CM | POA: Diagnosis not present

## 2023-02-02 DIAGNOSIS — R6883 Chills (without fever): Secondary | ICD-10-CM

## 2023-02-02 DIAGNOSIS — J209 Acute bronchitis, unspecified: Secondary | ICD-10-CM | POA: Insufficient documentation

## 2023-02-02 DIAGNOSIS — Z6829 Body mass index (BMI) 29.0-29.9, adult: Secondary | ICD-10-CM | POA: Diagnosis not present

## 2023-02-02 LAB — POC INFLUENZA A&B (BINAX/QUICKVUE)
Influenza A, POC: NEGATIVE
Influenza B, POC: NEGATIVE

## 2023-02-02 LAB — POC COVID19 BINAXNOW: SARS Coronavirus 2 Ag: NEGATIVE

## 2023-02-02 MED ORDER — GUAIFENESIN-CODEINE 100-10 MG/5ML PO SOLN: 10.0000 mL | Freq: Four times a day (QID) | ORAL | 0 refills | Status: AC | PRN

## 2023-02-02 MED ORDER — SALINE SPRAY 0.65 % NA SOLN: 2.0000 | Freq: Four times a day (QID) | NASAL | 0 refills | Status: AC | PRN

## 2023-02-02 MED ORDER — AZITHROMYCIN 250 MG PO TABS: ORAL_TABLET | ORAL | 0 refills | Status: AC

## 2023-02-02 NOTE — Progress Notes (Signed)
   Acute Office Visit  Subjective:     Patient ID: Claudia Rivas, female    DOB: 14-Aug-1971, 52 y.o.   MRN: [id number]  Chief Complaint  Patient presents with   Chills    Pt feeling cold and chills    HPI Patient is in today for sore throat, cough, fever and chills. She started feeling right chest pain since last night, Pain get worse when she breath in, she is coughing with  yellow sputum. Her nose is still running. She says she was wheezing last night. She still has low grade fever and chills.  No nausea and vomiting.     Review of Systems  Constitutional:  Positive for chills and fever.  HENT:  Positive for congestion and sore throat.   Respiratory:  Positive for cough, sputum production and wheezing.   Cardiovascular:  Positive for chest pain.  Gastrointestinal: Negative.         Objective:    BP 122/78 (BP Location: Right Arm, Patient Position: Sitting, Cuff Size: Normal)   Pulse 87   Temp (!) 97 F (36.1 C)   Resp 18   Ht 5' 1"$  (1.549 m)   Wt 155 lb 6 oz (70.5 kg)   LMP 11/11/2018   SpO2 97%   BMI 29.36 kg/m    Physical Exam Constitutional:      Appearance: Normal appearance.  Cardiovascular:     Rate and Rhythm: Normal rate and regular rhythm.     Heart sounds: Normal heart sounds.  Neurological:     Mental Status: She is alert.     No results found for any visits on 02/02/23.      Assessment & Plan:   Problem List Items Addressed This Visit   None Visit Diagnoses     Chills    -  Primary   Relevant Orders   POC Influenza A&B(BINAX/QUICKVUE)   POC COVID-19 BinaxNow   Cough headache       Relevant Orders   POC Influenza A&B(BINAX/QUICKVUE)   POC COVID-19 BinaxNow       Meds ordered this encounter  Medications   azithromycin (ZITHROMAX Z-PAK) 250 MG tablet    Sig: Take 2 tablets (500 mg) on  Day 1,  followed by 1 tablet (250 mg) once daily on Days 2 through 5.    Dispense:  6 each    Refill:  0   sodium chloride (OCEAN) 0.65 %  SOLN nasal spray    Sig: Place 2 sprays into both nostrils 4 (four) times daily as needed for congestion.    Dispense:  30 mL    Refill:  0   guaiFENesin-codeine 100-10 MG/5ML syrup    Sig: Take 10 mLs by mouth every 6 (six) hours as needed for cough.    Dispense:  240 mL    Refill:  0    No follow-ups on file.  Garwin Brothers, MD

## 2023-02-02 NOTE — Assessment & Plan Note (Signed)
She will take zithromax, and nasal spray and if nit better then she will need cxray. She need to stay off work today and tomorrow.

## 2023-03-15 ENCOUNTER — Ambulatory Visit: Payer: Managed Care, Other (non HMO) | Admitting: Internal Medicine

## 2023-03-15 ENCOUNTER — Encounter: Payer: Self-pay | Admitting: Internal Medicine

## 2023-03-15 VITALS — BP 130/90 | HR 74 | Temp 97.6°F | Resp 18 | Ht 61.0 in | Wt 154.4 lb

## 2023-03-15 DIAGNOSIS — R071 Chest pain on breathing: Secondary | ICD-10-CM

## 2023-03-15 DIAGNOSIS — Z6829 Body mass index (BMI) 29.0-29.9, adult: Secondary | ICD-10-CM | POA: Diagnosis not present

## 2023-03-15 NOTE — Assessment & Plan Note (Signed)
Muscular pain due to lifting heavy object. EKG shows NSR no ischemia

## 2023-03-15 NOTE — Progress Notes (Signed)
   Acute Office Visit  Subjective:     Patient ID: Claudia Rivas, female    DOB: 1971-02-05, 52 y.o.   MRN: [id number]  Chief Complaint  Patient presents with   New Patient (Initial Visit)    Patient feeling chest pain      HPI Patient is in today for right chest pressure started 1 hour ago. Pain started after she lifted heavy object and felt pressure mid chest, pain slightly increase with deep breath in.   Review of Systems  Constitutional: Negative.   Respiratory: Negative.    Cardiovascular:  Positive for chest pain.  Gastrointestinal: Negative.         Objective:    BP (!) 130/90 (BP Location: Left Arm, Patient Position: Sitting, Cuff Size: Normal)   Pulse 74   Temp 97.6 F (36.4 C)   Resp 18   Ht 5\' 1"  (1.549 m)   Wt 154 lb 6 oz (70 kg)   LMP 11/11/2018   SpO2 99%   BMI 29.17 kg/m    Physical Exam Constitutional:      Appearance: Normal appearance.  Cardiovascular:     Rate and Rhythm: Normal rate and regular rhythm.     Heart sounds: Normal heart sounds.  Pulmonary:     Effort: Pulmonary effort is normal.     Breath sounds: Normal breath sounds.  Abdominal:     General: Bowel sounds are normal.     Palpations: Abdomen is soft.  Neurological:     Mental Status: She is alert.     No results found for any visits on 03/15/23.      Assessment & Plan:   Problem List Items Addressed This Visit       Other   Chest pain on breathing - Primary    Muscular pain due to lifting heavy object. EKG shows NSR no ischemia       No orders of the defined types were placed in this encounter.   Return in about 3 months (around 06/14/2023).  Garwin Brothers, MD

## 2023-03-16 ENCOUNTER — Encounter: Payer: Self-pay | Admitting: Family

## 2023-06-28 ENCOUNTER — Ambulatory Visit: Payer: Managed Care, Other (non HMO) | Admitting: Internal Medicine
